# Patient Record
Sex: Male | Born: 1950 | Race: White | Hispanic: No | Marital: Married | Smoking: Never smoker
Health system: Southern US, Community
[De-identification: ages and names within clinical notes are randomized; demographics above are authoritative.]

## PROBLEM LIST (undated history)

## (undated) DIAGNOSIS — L570 Actinic keratosis: Secondary | ICD-10-CM

## (undated) DIAGNOSIS — C801 Malignant (primary) neoplasm, unspecified: Secondary | ICD-10-CM

## (undated) DIAGNOSIS — I10 Essential (primary) hypertension: Secondary | ICD-10-CM

## (undated) HISTORY — DX: Actinic keratosis: L57.0

## (undated) HISTORY — PX: SKIN CANCER EXCISION: SHX779

## (undated) HISTORY — DX: Malignant (primary) neoplasm, unspecified: C80.1

## (undated) HISTORY — PX: COLONOSCOPY WITH PROPOFOL: SHX5780

---

## 2009-01-25 ENCOUNTER — Emergency Department: Payer: Self-pay | Admitting: Emergency Medicine

## 2010-02-02 ENCOUNTER — Emergency Department: Payer: Self-pay | Admitting: Emergency Medicine

## 2010-02-05 ENCOUNTER — Emergency Department: Payer: Self-pay | Admitting: Emergency Medicine

## 2010-02-09 ENCOUNTER — Emergency Department: Payer: Self-pay | Admitting: Emergency Medicine

## 2010-02-17 ENCOUNTER — Emergency Department: Payer: Self-pay | Admitting: Emergency Medicine

## 2010-03-02 ENCOUNTER — Emergency Department: Payer: Self-pay | Admitting: Emergency Medicine

## 2014-12-13 ENCOUNTER — Ambulatory Visit (INDEPENDENT_AMBULATORY_CARE_PROVIDER_SITE_OTHER): Payer: 59 | Admitting: Family Medicine

## 2014-12-13 ENCOUNTER — Encounter: Payer: Self-pay | Admitting: Family Medicine

## 2014-12-13 VITALS — BP 123/68 | HR 69 | Resp 16 | Ht 71.0 in | Wt 220.0 lb

## 2014-12-13 DIAGNOSIS — Z0000 Encounter for general adult medical examination without abnormal findings: Secondary | ICD-10-CM | POA: Diagnosis not present

## 2014-12-13 DIAGNOSIS — Z85828 Personal history of other malignant neoplasm of skin: Secondary | ICD-10-CM | POA: Insufficient documentation

## 2014-12-13 DIAGNOSIS — Z23 Encounter for immunization: Secondary | ICD-10-CM | POA: Diagnosis not present

## 2014-12-13 DIAGNOSIS — T7840XA Allergy, unspecified, initial encounter: Secondary | ICD-10-CM | POA: Insufficient documentation

## 2014-12-13 DIAGNOSIS — Z85820 Personal history of malignant melanoma of skin: Secondary | ICD-10-CM | POA: Insufficient documentation

## 2014-12-13 NOTE — Progress Notes (Signed)
Name: Zachary Wall   MRN: [id number]    DOB: September 09, 1950   Date:12/13/2014       Progress Note  Subjective  Chief Complaint  Chief Complaint  Patient presents with  . Annual Exam    HPI  Here for annual physical exam.  Hx. Of malignant melanoma in situ.  Followed by Dr. Nehemiah Massed, Derm.  No c/o.  Has never had colonoscopy.  Work Architect.  Multiple minor cuts.  Can't remember last tet. Toxoid immun.  Past Medical History  Diagnosis Date  . Cancer     skin    History reviewed. No pertinent past surgical history.  Family History  Problem Relation Age of Onset  . Family history unknown: Yes    History   Social History  . Marital Status: Married    Spouse Name: N/A  . Number of Children: N/A  . Years of Education: N/A   Occupational History  . Not on file.   Social History Main Topics  . Smoking status: Never Smoker   . Smokeless tobacco: Never Used  . Alcohol Use: No  . Drug Use: No  . Sexual Activity: Not on file   Other Topics Concern  . Not on file   Social History Narrative  . No narrative on file     Current outpatient prescriptions:  .  Cetirizine HCl (ZYRTEC ALLERGY) 10 MG CAPS, Take by mouth., Disp: , Rfl:  .  diphenhydrAMINE (BENADRYL ALLERGY) 25 mg capsule, Take by mouth., Disp: , Rfl:  .  predniSONE (STERAPRED UNI-PAK 21 TAB) 10 MG (21) TBPK tablet, Take by mouth., Disp: , Rfl:  .  ranitidine (ZANTAC) 150 MG capsule, Take by mouth., Disp: , Rfl:   Allergies  Allergen Reactions  . Azithromycin Rash     Review of Systems  Constitutional: Negative.  Negative for fever, chills, weight loss and malaise/fatigue.  HENT: Negative.  Negative for congestion, ear pain and hearing loss.   Eyes: Negative.  Negative for blurred vision and double vision.  Respiratory: Negative.  Negative for cough, sputum production, shortness of breath and wheezing.   Cardiovascular: Negative.  Negative for chest pain, palpitations, orthopnea and leg swelling.   Gastrointestinal: Negative.  Negative for heartburn, nausea, vomiting, abdominal pain, diarrhea and blood in stool.  Genitourinary: Negative.  Negative for dysuria, urgency, frequency, hematuria and flank pain.  Musculoskeletal: Negative.  Negative for myalgias, back pain, joint pain and neck pain.  Skin: Negative.  Negative for rash.  Neurological: Negative.  Negative for dizziness, tremors, sensory change, seizures, weakness and headaches.  Endo/Heme/Allergies: Negative.  Negative for polydipsia.  Psychiatric/Behavioral: Negative.  Negative for depression, suicidal ideas and memory loss. The patient is not nervous/anxious and does not have insomnia.       Objective  Filed Vitals:   12/13/14 0917  BP: 123/68  Pulse: 69  Resp: 16  Height: 5\' 11"  (1.803 m)  Weight: 220 lb (99.791 kg)    Physical Exam  Constitutional: He is oriented to person, place, and time and well-developed, well-nourished, and in no distress. No distress.  HENT:  Head: Normocephalic and atraumatic.  Right Ear: External ear normal.  Left Ear: External ear normal.  Nose: Nose normal.  Mouth/Throat: Oropharynx is clear and moist. No oropharyngeal exudate.  Eyes: Conjunctivae and EOM are normal. Pupils are equal, round, and reactive to light. No scleral icterus.  Fundoscopic exam:      The right eye shows no arteriolar narrowing, no AV nicking, no hemorrhage and no  papilledema.       The left eye shows no arteriolar narrowing, no AV nicking, no hemorrhage and no papilledema.  Neck: Neck supple. No tracheal deviation present. No thyromegaly present.  Cardiovascular: Normal rate, regular rhythm, normal heart sounds and intact distal pulses.  Exam reveals no gallop and no friction rub.   No murmur heard. Pulmonary/Chest: Effort normal and breath sounds normal. No respiratory distress. He has no wheezes. He exhibits no tenderness.  Abdominal: Soft. Bowel sounds are normal. He exhibits no distension and no mass.  There is no tenderness.  Genitourinary: Rectum normal, testes/scrotum normal and penis normal. Prostate is enlarged (slightly enlarged, no asssymetry). Prostate is not tender. No discharge found.  Musculoskeletal: Normal range of motion. He exhibits no edema or tenderness.  Lymphadenopathy:    He has no cervical adenopathy.  Neurological: He is alert and oriented to person, place, and time. No cranial nerve deficit.  Skin: Skin is warm and dry. Lesion: multiple diffuse AKs.    Old Melanoma removal scar on L. mid to upper back.  Psychiatric: Memory, affect and judgment normal.  Vitals reviewed.         Assessment & Plan  Problem List Items Addressed This Visit      Other   Encounter for general adult medical examination without abnormal findings - Primary   Relevant Orders   Ambulatory referral to Gastroenterology   Tdap vaccine greater than or equal to 7yo IM   Lipid Profile   CBC with Differential   Comprehensive Metabolic Panel (CMET)   PSA   H/O malignant neoplasm of skin      1. Encounter for general adult medical examination without abnormal findings  - Ambulatory referral to Gastroenterology - Tdap vaccine greater than or equal to 7yo IM  2. H/O malignant neoplasm of skin Continue to follow up with Dr. Nehemiah Massed, Derm.

## 2014-12-15 LAB — CBC WITH DIFFERENTIAL/PLATELET
BASOS: 0 %
Basophils Absolute: 0 10*3/uL (ref 0.0–0.2)
EOS (ABSOLUTE): 0.2 10*3/uL (ref 0.0–0.4)
Eos: 4 %
HEMATOCRIT: 45 % (ref 37.5–51.0)
Hemoglobin: 16 g/dL (ref 12.6–17.7)
IMMATURE GRANS (ABS): 0 10*3/uL (ref 0.0–0.1)
Immature Granulocytes: 0 %
Lymphocytes Absolute: 2.1 10*3/uL (ref 0.7–3.1)
Lymphs: 39 %
MCH: 32.3 pg (ref 26.6–33.0)
MCHC: 35.6 g/dL (ref 31.5–35.7)
MCV: 91 fL (ref 79–97)
MONOS ABS: 0.7 10*3/uL (ref 0.1–0.9)
Monocytes: 13 %
Neutrophils Absolute: 2.4 10*3/uL (ref 1.4–7.0)
Neutrophils: 44 %
Platelets: 216 10*3/uL (ref 150–379)
RBC: 4.95 x10E6/uL (ref 4.14–5.80)
RDW: 12.4 % (ref 12.3–15.4)
WBC: 5.4 10*3/uL (ref 3.4–10.8)

## 2014-12-16 LAB — COMPREHENSIVE METABOLIC PANEL
ALT: 12 IU/L (ref 0–44)
AST: 19 IU/L (ref 0–40)
Albumin/Globulin Ratio: 1.9 (ref 1.1–2.5)
Albumin: 4.3 g/dL (ref 3.6–4.8)
Alkaline Phosphatase: 51 IU/L (ref 39–117)
BUN/Creatinine Ratio: 19 (ref 10–22)
BUN: 18 mg/dL (ref 8–27)
Bilirubin Total: 0.8 mg/dL (ref 0.0–1.2)
CALCIUM: 9 mg/dL (ref 8.6–10.2)
CO2: 22 mmol/L (ref 18–29)
CREATININE: 0.96 mg/dL (ref 0.76–1.27)
Chloride: 100 mmol/L (ref 97–108)
GFR calc Af Amer: 98 mL/min/{1.73_m2} (ref >59)
GFR, EST NON AFRICAN AMERICAN: 85 mL/min/{1.73_m2} (ref >59)
GLOBULIN, TOTAL: 2.3 g/dL (ref 1.5–4.5)
Glucose: 94 mg/dL (ref 65–99)
Potassium: 4.9 mmol/L (ref 3.5–5.2)
SODIUM: 139 mmol/L (ref 134–144)
Total Protein: 6.6 g/dL (ref 6.0–8.5)

## 2014-12-16 LAB — LIPID PANEL
CHOL/HDL RATIO: 3.7 ratio (ref 0.0–5.0)
CHOLESTEROL TOTAL: 175 mg/dL (ref 100–199)
HDL: 47 mg/dL (ref >39)
LDL Calculated: 114 mg/dL — ABNORMAL HIGH (ref 0–99)
TRIGLYCERIDES: 70 mg/dL (ref 0–149)
VLDL Cholesterol Cal: 14 mg/dL (ref 5–40)

## 2014-12-16 LAB — PSA: PROSTATE SPECIFIC AG, SERUM: 1.4 ng/mL (ref 0.0–4.0)

## 2014-12-20 NOTE — Progress Notes (Signed)
Sent letter with results. [facility]

## 2015-03-21 LAB — HM COLONOSCOPY

## 2015-05-12 ENCOUNTER — Encounter: Payer: Self-pay | Admitting: Family Medicine

## 2015-05-16 ENCOUNTER — Encounter: Payer: Self-pay | Admitting: Family Medicine

## 2016-04-30 ENCOUNTER — Ambulatory Visit (INDEPENDENT_AMBULATORY_CARE_PROVIDER_SITE_OTHER): Payer: 59 | Admitting: Family Medicine

## 2016-04-30 ENCOUNTER — Encounter: Payer: Self-pay | Admitting: Family Medicine

## 2016-04-30 VITALS — BP 150/90 | HR 66 | Temp 98.0°F | Resp 16 | Ht 71.0 in | Wt 217.0 lb

## 2016-04-30 DIAGNOSIS — M25519 Pain in unspecified shoulder: Secondary | ICD-10-CM

## 2016-04-30 DIAGNOSIS — Z0000 Encounter for general adult medical examination without abnormal findings: Secondary | ICD-10-CM

## 2016-04-30 DIAGNOSIS — G8929 Other chronic pain: Secondary | ICD-10-CM

## 2016-04-30 DIAGNOSIS — I10 Essential (primary) hypertension: Secondary | ICD-10-CM | POA: Insufficient documentation

## 2016-04-30 DIAGNOSIS — M25511 Pain in right shoulder: Secondary | ICD-10-CM | POA: Diagnosis not present

## 2016-04-30 MED ORDER — LISINOPRIL 10 MG PO TABS: 10.0000 mg | ORAL_TABLET | Freq: Every day | ORAL | 6 refills | Status: DC

## 2016-04-30 NOTE — Progress Notes (Signed)
Name: Zachary Wall   MRN: [id number]    DOB: 06-Sep-1950   Date:04/30/2016       Progress Note  Subjective  Chief Complaint  Chief Complaint  Patient presents with  . Annual Exam    HPI Here for annual health maintenence.  Has hx of malignant melanoma in situ.  Sees Derm once yearly.  His BP has run borderline high recently.  Had some  Lab done for biometrics for work, but he does not know what was checked.  He declines a flu shot.  Had colonoscopy last year with few polyps.  Plan repeat in 5 years.. No problem-specific Assessment & Plan notes found for this encounter.   Past Medical History:  Diagnosis Date  . Cancer ([place])    skin    History reviewed. No pertinent surgical history.  Family History  Problem Relation Age of Onset  . Family history unknown: Yes    Social History   Social History  . Marital status: Married    Spouse name: N/A  . Number of children: N/A  . Years of education: N/A   Occupational History  . Not on file.   Social History Main Topics  . Smoking status: Never Smoker  . Smokeless tobacco: Never Used  . Alcohol use No  . Drug use: No  . Sexual activity: Yes   Other Topics Concern  . Not on file   Social History Narrative  . No narrative on file     Current Outpatient Prescriptions:  .  Cetirizine HCl (ZYRTEC ALLERGY) 10 MG CAPS, Take 1 capsule by mouth daily as needed. , Disp: , Rfl:  .  ranitidine (ZANTAC) 150 MG capsule, Take 150 mg by mouth daily as needed. , Disp: , Rfl:  .  lisinopril (PRINIVIL,ZESTRIL) 10 MG tablet, Take 1 tablet (10 mg total) by mouth daily., Disp: 30 tablet, Rfl: 6  Allergies  Allergen Reactions  . Azithromycin Rash     Review of Systems  Constitutional: Negative for chills, fever, malaise/fatigue and weight loss.  HENT: Negative for hearing loss.   Eyes: Negative for blurred vision and double vision.  Respiratory: Negative for cough, shortness of breath and wheezing.   Cardiovascular: Negative for  chest pain, palpitations, orthopnea and leg swelling.  Gastrointestinal: Negative for abdominal pain, blood in stool and heartburn.  Genitourinary: Negative for dysuria, frequency and urgency.  Musculoskeletal: Positive for joint pain (R shoulder pain). Negative for myalgias.  Skin: Negative for rash.  Neurological: Negative for dizziness, tremors, weakness and headaches.  Psychiatric/Behavioral: Negative for depression. The patient does not have insomnia.       Objective  Vitals:   04/30/16 1406 04/30/16 1517  BP: (!) 155/95 (!) 150/90  Pulse: 66   Resp: 16   Temp: 98 F (36.7 C)   TempSrc: Oral   Weight: 217 lb (98.4 kg)   Height: 5\' 11"  (1.803 m)     Physical Exam  Constitutional: He is oriented to person, place, and time and well-developed, well-nourished, and in no distress. No distress.  HENT:  Head: Normocephalic and atraumatic.  Right Ear: External ear normal.  Left Ear: External ear normal.  Nose: Nose normal.  Mouth/Throat: Oropharynx is clear and moist. No oropharyngeal exudate.  Eyes: Conjunctivae and EOM are normal. Pupils are equal, round, and reactive to light. No scleral icterus.  Fundoscopic exam:      The right eye shows no arteriolar narrowing, no AV nicking, no exudate, no hemorrhage and no papilledema.  The left eye shows no arteriolar narrowing, no AV nicking, no exudate, no hemorrhage and no papilledema.  Neck: Normal range of motion. Neck supple. Carotid bruit is not present. No thyromegaly present.  Cardiovascular: Normal rate, regular rhythm and normal heart sounds.  Exam reveals no gallop and no friction rub.   No murmur heard. Pulmonary/Chest: Effort normal and breath sounds normal. No respiratory distress. He has no wheezes. He has no rales.  Abdominal: Soft. Bowel sounds are normal. He exhibits no distension and no mass. There is no tenderness. There is no rebound and no guarding.  Genitourinary: Rectum normal, prostate normal and penis  normal. No discharge found.  Musculoskeletal: Normal range of motion. He exhibits no edema.  Lymphadenopathy:    He has no cervical adenopathy.  Neurological: He is alert and oriented to person, place, and time.  Skin: Skin is warm and dry.  Multiple scars from skin lesion removals.  Multiple AKs and SKs.  Vitals reviewed.      No results found for this or any previous visit (from the past 2160 hour(s)).   Assessment & Plan  Problem List Items Addressed This Visit      Cardiovascular and Mediastinum   HBP (high blood pressure)   Relevant Medications   lisinopril (PRINIVIL,ZESTRIL) 10 MG tablet   Other Relevant Orders   CBC with Differential   Lipid Profile   Comprehensive Metabolic Panel (CMET)     Other   Healthcare maintenance - Primary   Relevant Orders   PSA   Chronic shoulder pain   Relevant Orders   Ambulatory referral to Orthopedic Surgery    Other Visit Diagnoses   None.     Meds ordered this encounter  Medications  . lisinopril (PRINIVIL,ZESTRIL) 10 MG tablet    Sig: Take 1 tablet (10 mg total) by mouth daily.    Dispense:  30 tablet    Refill:  6   1. Essential hypertension  - CBC with Differential - Lipid Profile - Comprehensive Metabolic Panel (CMET) - lisinopril (PRINIVIL,ZESTRIL) 10 MG tablet; Take 1 tablet (10 mg total) by mouth daily.  Dispense: 30 tablet; Refill: 6  2. Chronic right shoulder pain Refer [facility]  3. Healthcare maintenance  - PSA

## 2016-05-03 LAB — COMPREHENSIVE METABOLIC PANEL
ALT: 24 IU/L (ref 0–44)
AST: 16 IU/L (ref 0–40)
Albumin/Globulin Ratio: 1.8 (ref 1.2–2.2)
Albumin: 4.5 g/dL (ref 3.6–4.8)
Alkaline Phosphatase: 65 IU/L (ref 39–117)
BUN/Creatinine Ratio: 15 (ref 10–24)
BUN: 15 mg/dL (ref 8–27)
Bilirubin Total: 0.5 mg/dL (ref 0.0–1.2)
CO2: 24 mmol/L (ref 18–29)
CREATININE: 0.99 mg/dL (ref 0.76–1.27)
Calcium: 9.4 mg/dL (ref 8.6–10.2)
Chloride: 99 mmol/L (ref 96–106)
GFR, EST AFRICAN AMERICAN: 94 mL/min/{1.73_m2} (ref >59)
GFR, EST NON AFRICAN AMERICAN: 81 mL/min/{1.73_m2} (ref >59)
Globulin, Total: 2.5 g/dL (ref 1.5–4.5)
Glucose: 105 mg/dL — ABNORMAL HIGH (ref 65–99)
Potassium: 4.9 mmol/L (ref 3.5–5.2)
Sodium: 140 mmol/L (ref 134–144)
TOTAL PROTEIN: 7 g/dL (ref 6.0–8.5)

## 2016-05-03 LAB — CBC WITH DIFFERENTIAL/PLATELET
BASOS ABS: 0 10*3/uL (ref 0.0–0.2)
BASOS: 1 %
EOS (ABSOLUTE): 0.2 10*3/uL (ref 0.0–0.4)
Eos: 4 %
Hematocrit: 45.8 % (ref 37.5–51.0)
Hemoglobin: 16.1 g/dL (ref 12.6–17.7)
IMMATURE GRANS (ABS): 0 10*3/uL (ref 0.0–0.1)
IMMATURE GRANULOCYTES: 0 %
LYMPHS: 43 %
Lymphocytes Absolute: 2.4 10*3/uL (ref 0.7–3.1)
MCH: 32.3 pg (ref 26.6–33.0)
MCHC: 35.2 g/dL (ref 31.5–35.7)
MCV: 92 fL (ref 79–97)
MONOS ABS: 0.7 10*3/uL (ref 0.1–0.9)
Monocytes: 13 %
NEUTROS ABS: 2.1 10*3/uL (ref 1.4–7.0)
NEUTROS PCT: 39 %
PLATELETS: 211 10*3/uL (ref 150–379)
RBC: 4.98 x10E6/uL (ref 4.14–5.80)
RDW: 12.6 % (ref 12.3–15.4)
WBC: 5.4 10*3/uL (ref 3.4–10.8)

## 2016-05-03 LAB — LIPID PANEL
CHOLESTEROL TOTAL: 202 mg/dL — AB (ref 100–199)
Chol/HDL Ratio: 3.5 ratio units (ref 0.0–5.0)
HDL: 57 mg/dL (ref >39)
LDL Calculated: 131 mg/dL — ABNORMAL HIGH (ref 0–99)
Triglycerides: 71 mg/dL (ref 0–149)
VLDL CHOLESTEROL CAL: 14 mg/dL (ref 5–40)

## 2016-05-03 LAB — PSA: PROSTATE SPECIFIC AG, SERUM: 2.2 ng/mL (ref 0.0–4.0)

## 2016-05-06 ENCOUNTER — Telehealth: Payer: Self-pay | Admitting: *Deleted

## 2016-05-06 ENCOUNTER — Other Ambulatory Visit: Payer: Self-pay | Admitting: Family Medicine

## 2016-05-06 DIAGNOSIS — R972 Elevated prostate specific antigen [PSA]: Secondary | ICD-10-CM

## 2016-05-06 NOTE — Telephone Encounter (Signed)
Lab result encounter accidentally closed by Dr. Luan Pulling therefore lab result written down. 1) Sugar up 105 A1C added 2. Lipids worse than last year 3) PSA elevated from 1.4 to 2.2. Needs referral to urology. Patient's choice [facility] or [facility].

## 2016-05-11 LAB — HGB A1C W/O EAG: HEMOGLOBIN A1C: 5.7 % — AB (ref 4.8–5.6)

## 2016-05-11 LAB — SPECIMEN STATUS REPORT

## 2016-05-21 ENCOUNTER — Encounter: Payer: Self-pay | Admitting: Urology

## 2016-05-21 ENCOUNTER — Ambulatory Visit (INDEPENDENT_AMBULATORY_CARE_PROVIDER_SITE_OTHER): Payer: 59 | Admitting: Urology

## 2016-05-21 VITALS — BP 160/89 | HR 74 | Ht 72.0 in | Wt 222.2 lb

## 2016-05-21 DIAGNOSIS — R972 Elevated prostate specific antigen [PSA]: Secondary | ICD-10-CM | POA: Insufficient documentation

## 2016-05-21 NOTE — Progress Notes (Signed)
05/21/2016 1:33 PM   BREON ENZ 1950/12/14 [id number]  Referring provider: Arlis Porta., MD [place], [place] [id number]  Chief Complaint  Patient presents with  . New Patient (Initial Visit)    elevated PSA    HPI: Mr Zachary Wall is a 65yo seen for evaluation today for rising PSA. His PSA on referral is 2.2 His PSA 1 year ago was 1.4.  He has mild LUTS with occasional nocturia 1x. No hematuria, no dysuria. No hx of UTI or prostate infections. No hx of prostate biopsy. No family hx of prostate cancer. No other associated symptoms. No exacerbating/alleviating events.    PMH: Past Medical History:  Diagnosis Date  . Cancer [facility])    skin    Surgical History: Past Surgical History:  Procedure Laterality Date  . SKIN CANCER EXCISION      Home Medications:    Medication List       Accurate as of 05/21/16  1:33 PM. Always use your most recent med list.          Cetirizine HCl 10 MG Caps Take by mouth.   lisinopril 10 MG tablet Commonly known as:  PRINIVIL,ZESTRIL Take by mouth.   meloxicam 7.5 MG tablet Commonly known as:  MOBIC Take by mouth.   ranitidine 150 MG capsule Commonly known as:  ZANTAC Take 150 mg by mouth daily as needed.       Allergies:  Allergies  Allergen Reactions  . Azithromycin Rash    Family History: Family History  Problem Relation Age of Onset  . Hematuria Neg Hx   . Prostate cancer Neg Hx   . Tuberculosis Neg Hx     Social History:  reports that he has never smoked. He has never used smokeless tobacco. He reports that he does not drink alcohol or use drugs.  ROS: UROLOGY Frequent Urination?: No Hard to postpone urination?: No Burning/pain with urination?: No Get up at night to urinate?: Yes Leakage of urine?: Yes Urine stream starts and stops?: No Trouble starting stream?: No Do you have to strain to urinate?: No Blood in urine?: No Urinary tract infection?: No Sexually transmitted disease?:  No Injury to kidneys or bladder?: No Painful intercourse?: No Weak stream?: No Erection problems?: Yes Penile pain?: No  Gastrointestinal Nausea?: No Vomiting?: No Indigestion/heartburn?: Yes Diarrhea?: No Constipation?: No  Constitutional Fever: No Night sweats?: No Weight loss?: No Fatigue?: No  Skin Skin rash/lesions?: No Itching?: No  Eyes Blurred vision?: No Double vision?: No  Ears/Nose/Throat Sore throat?: No Sinus problems?: No  Hematologic/Lymphatic Swollen glands?: No Easy bruising?: No  Cardiovascular Leg swelling?: No Chest pain?: No  Respiratory Cough?: No Shortness of breath?: No  Endocrine Excessive thirst?: No  Musculoskeletal Back pain?: No Joint pain?: No  Neurological Headaches?: No Dizziness?: No  Psychologic Depression?: No Anxiety?: No  Physical Exam: BP (!) 160/89   Pulse 74   Ht 6' (1.829 m)   Wt 100.8 kg (222 lb 3.2 oz)   BMI 30.14 kg/m   Constitutional:  Alert and oriented, No acute distress. HEENT: [place] AT, moist mucus membranes.  Trachea midline, no masses. Cardiovascular: No clubbing, cyanosis, or edema. Respiratory: Normal respiratory effort, no increased work of breathing. GI: Abdomen is soft, nontender, nondistended, no abdominal masses GU: No CVA tenderness. Circumcised phallus. No masses/lesiosn on penis/testes/scrotum. Prostate 30g smooth, no induration, no nodules Skin: No rashes, bruises or suspicious lesions. Lymph: No cervical or inguinal adenopathy. Neurologic: Grossly intact, no focal  deficits, moving all 4 extremities. Psychiatric: Normal mood and affect.  Laboratory Data: Lab Results  Component Value Date   WBC 5.4 05/02/2016   HCT 45.8 05/02/2016   MCV 92 05/02/2016   PLT 211 05/02/2016    Lab Results  Component Value Date   CREATININE 0.99 05/02/2016    No results found for: PSA  No results found for: TESTOSTERONE  Lab Results  Component Value Date   HGBA1C 5.7 (H) 05/02/2016     Urinalysis No results found for: COLORURINE, APPEARANCEUR, LABSPEC, PHURINE, GLUCOSEU, HGBUR, BILIRUBINUR, KETONESUR, PROTEINUR, UROBILINOGEN, NITRITE, LEUKOCYTESUR  Pertinent Imaging: none  Assessment & Plan:    1. Increasing PSA -We discussed observation versus biopsy and we have elected to proceed with observation. RTC 6 months with a PSA   There are no diagnoses linked to this encounter.  No Follow-up on file.  Nicolette Bang, MD  [facility] [address] [place], [place] [address] [phone]

## 2016-06-11 ENCOUNTER — Encounter: Payer: Self-pay | Admitting: Family Medicine

## 2016-06-11 ENCOUNTER — Ambulatory Visit (INDEPENDENT_AMBULATORY_CARE_PROVIDER_SITE_OTHER): Payer: 59 | Admitting: Family Medicine

## 2016-06-11 VITALS — BP 145/80 | HR 76 | Temp 97.8°F | Resp 16 | Ht 71.0 in | Wt 226.0 lb

## 2016-06-11 DIAGNOSIS — I10 Essential (primary) hypertension: Secondary | ICD-10-CM | POA: Diagnosis not present

## 2016-06-11 MED ORDER — LISINOPRIL 20 MG PO TABS: 20.0000 mg | ORAL_TABLET | Freq: Every day | ORAL | 6 refills | Status: DC

## 2016-06-11 NOTE — Progress Notes (Signed)
Name: Zachary Wall   MRN: [id number]    DOB: [birth year]   Date:06/11/2016       Progress Note  Subjective  Chief Complaint  Chief Complaint  Patient presents with  . Hypertension    HPI Here for f/u of HBP.  On Lisinopril for 4-6 weeks.  No problems with meds..  On Mobic for R shoulder pain for past 2-3 weeks.  Otherwise doing well.  No problem-specific Assessment & Plan notes found for this encounter.   Past Medical History:  Diagnosis Date  . Cancer [facility])    skin    Past Surgical History:  Procedure Laterality Date  . SKIN CANCER EXCISION      Family History  Problem Relation Age of Onset  . Hematuria Neg Hx   . Prostate cancer Neg Hx   . Tuberculosis Neg Hx     Social History   Social History  . Marital status: Married    Spouse name: N/A  . Number of children: N/A  . Years of education: N/A   Occupational History  . Not on file.   Social History Main Topics  . Smoking status: Never Smoker  . Smokeless tobacco: Never Used  . Alcohol use No  . Drug use: No  . Sexual activity: Yes   Other Topics Concern  . Not on file   Social History Narrative  . No narrative on file     Current Outpatient Prescriptions:  .  lisinopril (PRINIVIL,ZESTRIL) 10 MG tablet, Take by mouth., Disp: , Rfl:  .  meloxicam (MOBIC) 7.5 MG tablet, Take by mouth., Disp: , Rfl:  .  ranitidine (ZANTAC) 150 MG capsule, Take 150 mg by mouth daily as needed. , Disp: , Rfl:   Allergies  Allergen Reactions  . Azithromycin Rash     Review of Systems  Constitutional: Negative for chills, fever, malaise/fatigue and weight loss.  HENT: Negative for hearing loss and tinnitus.   Eyes: Negative for blurred vision and double vision.  Respiratory: Negative for cough, shortness of breath and wheezing.   Cardiovascular: Negative for chest pain, palpitations and leg swelling.  Gastrointestinal: Negative for abdominal pain, blood in stool and heartburn.  Genitourinary: Negative for  dysuria, frequency and urgency.  Musculoskeletal: Positive for joint pain. Negative for myalgias.  Skin: Negative for itching and rash.  Neurological: Negative for dizziness, tingling, tremors, weakness and headaches.      Objective  Vitals:   06/11/16 0842  BP: (!) 159/91  Pulse: 76  Resp: 16  Temp: 97.8 F (36.6 C)  TempSrc: Oral  Weight: 226 lb (102.5 kg)  Height: 5\' 11"  (1.803 m)    Physical Exam  Constitutional: He is oriented to person, place, and time and well-developed, well-nourished, and in no distress. No distress.  HENT:  Head: Normocephalic and atraumatic.  Eyes: Conjunctivae and EOM are normal. Pupils are equal, round, and reactive to light. No scleral icterus.  Neck: Normal range of motion. Neck supple. Carotid bruit is not present. No thyromegaly present.  Cardiovascular: Normal rate, regular rhythm and normal heart sounds.  Exam reveals no gallop and no friction rub.   No murmur heard. Pulmonary/Chest: Effort normal and breath sounds normal. No respiratory distress. He has no wheezes. He has no rales.  Musculoskeletal: He exhibits no edema.  Lymphadenopathy:    He has no cervical adenopathy.  Neurological: He is alert and oriented to person, place, and time. Gait normal.  Vitals reviewed.      Recent Results (  from the past 2160 hour(s))  CBC with Differential     Status: None   Collection Time: 05/02/16  8:21 AM  Result Value Ref Range   WBC 5.4 3.4 - 10.8 x10E3/uL   RBC 4.98 4.14 - 5.80 x10E6/uL   Hemoglobin 16.1 12.6 - 17.7 g/dL   Hematocrit 45.8 37.5 - 51.0 %   MCV 92 79 - 97 fL   MCH 32.3 26.6 - 33.0 pg   MCHC 35.2 31.5 - 35.7 g/dL   RDW 12.6 12.3 - 15.4 %   Platelets 211 150 - 379 x10E3/uL   Neutrophils 39 Not Estab. %   Lymphs 43 Not Estab. %   Monocytes 13 Not Estab. %   Eos 4 Not Estab. %   Basos 1 Not Estab. %   Neutrophils Absolute 2.1 1.4 - 7.0 x10E3/uL   Lymphocytes Absolute 2.4 0.7 - 3.1 x10E3/uL   Monocytes Absolute 0.7 0.1 -  0.9 x10E3/uL   EOS (ABSOLUTE) 0.2 0.0 - 0.4 x10E3/uL   Basophils Absolute 0.0 0.0 - 0.2 x10E3/uL   Immature Granulocytes 0 Not Estab. %   Immature Grans (Abs) 0.0 0.0 - 0.1 x10E3/uL  Lipid Profile     Status: Abnormal   Collection Time: 05/02/16  8:21 AM  Result Value Ref Range   Cholesterol, Total 202 (H) 100 - 199 mg/dL   Triglycerides 71 0 - 149 mg/dL   HDL 57 >39 mg/dL   VLDL Cholesterol Cal 14 5 - 40 mg/dL   LDL Calculated 131 (H) 0 - 99 mg/dL   Chol/HDL Ratio 3.5 0.0 - 5.0 ratio units    Comment:                                   T. Chol/HDL Ratio                                             Men  Women                               1/2 Avg.Risk  3.4    3.3                                   Avg.Risk  5.0    4.4                                2X Avg.Risk  9.6    7.1                                3X Avg.Risk 23.4   11.0   PSA     Status: None   Collection Time: 05/02/16  8:21 AM  Result Value Ref Range   Prostate Specific Ag, Serum 2.2 0.0 - 4.0 ng/mL    Comment: Roche ECLIA methodology. According to the American Urological Association, Serum PSA should decrease and remain at undetectable levels after radical prostatectomy. The AUA defines biochemical recurrence as an initial PSA value 0.2 ng/mL or greater followed by a subsequent confirmatory PSA value 0.2 ng/mL or  greater. Values obtained with different assay methods or kits cannot be used interchangeably. Results cannot be interpreted as absolute evidence of the presence or absence of malignant disease.   Comprehensive Metabolic Panel (CMET)     Status: Abnormal   Collection Time: 05/02/16  8:21 AM  Result Value Ref Range   Glucose 105 (H) 65 - 99 mg/dL   BUN 15 8 - 27 mg/dL   Creatinine, Ser 0.99 0.76 - 1.27 mg/dL   GFR calc non Af Amer 81 >59 mL/min/1.73   GFR calc Af Amer 94 >59 mL/min/1.73   BUN/Creatinine Ratio 15 10 - 24   Sodium 140 134 - 144 mmol/L   Potassium 4.9 3.5 - 5.2 mmol/L   Chloride 99 96 - 106  mmol/L   CO2 24 18 - 29 mmol/L   Calcium 9.4 8.6 - 10.2 mg/dL   Total Protein 7.0 6.0 - 8.5 g/dL   Albumin 4.5 3.6 - 4.8 g/dL   Globulin, Total 2.5 1.5 - 4.5 g/dL   Albumin/Globulin Ratio 1.8 1.2 - 2.2   Bilirubin Total 0.5 0.0 - 1.2 mg/dL   Alkaline Phosphatase 65 39 - 117 IU/L   AST 16 0 - 40 IU/L   ALT 24 0 - 44 IU/L  Hgb A1c w/o eAG     Status: Abnormal   Collection Time: 05/02/16  8:21 AM  Result Value Ref Range   Hgb A1c MFr Bld 5.7 (H) 4.8 - 5.6 %    Comment:          Pre-diabetes: 5.7 - 6.4          Diabetes: >6.4          Glycemic control for adults with diabetes: <7.0   Specimen status report     Status: None   Collection Time: 05/02/16  8:21 AM  Result Value Ref Range   specimen status report Comment     Comment: Written Authorization Written Authorization Written Authorization Received. Authorization received from [place] 05-11-2016 Logged by [place]  Problem List Items Addressed This Visit    None      No orders of the defined types were placed in this encounter.

## 2016-06-17 DIAGNOSIS — C4492 Squamous cell carcinoma of skin, unspecified: Secondary | ICD-10-CM

## 2016-06-17 HISTORY — DX: Squamous cell carcinoma of skin, unspecified: C44.92

## 2016-07-30 ENCOUNTER — Encounter: Payer: Self-pay | Admitting: Family Medicine

## 2016-07-30 ENCOUNTER — Ambulatory Visit (INDEPENDENT_AMBULATORY_CARE_PROVIDER_SITE_OTHER): Payer: 59 | Admitting: Family Medicine

## 2016-07-30 VITALS — BP 115/75 | HR 66 | Temp 98.3°F | Ht 71.0 in | Wt 223.0 lb

## 2016-07-30 DIAGNOSIS — I10 Essential (primary) hypertension: Secondary | ICD-10-CM

## 2016-07-30 MED ORDER — LISINOPRIL 10 MG PO TABS: 10.0000 mg | ORAL_TABLET | Freq: Every day | ORAL | 6 refills | Status: DC

## 2016-07-30 NOTE — Progress Notes (Signed)
Name: Zachary Wall   MRN: [id number]    DOB: July 01, 1951   Date:07/30/2016       Progress Note  Subjective  Chief Complaint  Chief Complaint  Patient presents with  . Hypertension    HPI Here for f/u of HBP and GERD.  He is taking meds and does not have any problems at present.  He has been educated on ACE cough if necessary.  No problem-specific Assessment & Plan notes found for this encounter.   Past Medical History:  Diagnosis Date  . Cancer [facility])    skin    Past Surgical History:  Procedure Laterality Date  . SKIN CANCER EXCISION      Family History  Problem Relation Age of Onset  . Hematuria Neg Hx   . Prostate cancer Neg Hx   . Tuberculosis Neg Hx     Social History   Social History  . Marital status: Married    Spouse name: N/A  . Number of children: N/A  . Years of education: N/A   Occupational History  . Not on file.   Social History Main Topics  . Smoking status: Never Smoker  . Smokeless tobacco: Never Used  . Alcohol use No  . Drug use: No  . Sexual activity: Yes   Other Topics Concern  . Not on file   Social History Narrative  . No narrative on file     Current Outpatient Prescriptions:  .  lisinopril (PRINIVIL,ZESTRIL) 10 MG tablet, Take 1 tablet (10 mg total) by mouth daily., Disp: 30 tablet, Rfl: 6 .  ranitidine (ZANTAC) 150 MG capsule, Take 150 mg by mouth daily as needed. , Disp: , Rfl:   Allergies  Allergen Reactions  . Azithromycin Rash     Review of Systems  Constitutional: Negative for chills, fever, malaise/fatigue and weight loss.  HENT: Negative for hearing loss and tinnitus.   Eyes: Negative for blurred vision and double vision.  Respiratory: Negative for cough, shortness of breath and wheezing.   Cardiovascular: Negative for chest pain, palpitations and leg swelling.  Gastrointestinal: Positive for heartburn (under control with Zantac). Negative for abdominal pain, blood in stool, diarrhea, nausea and vomiting.   Genitourinary: Negative for dysuria, frequency and urgency.  Musculoskeletal: Negative for joint pain and myalgias.  Skin: Negative for rash.  Neurological: Negative for dizziness, tingling, tremors, weakness and headaches.      Objective  Vitals:   07/30/16 1555 07/30/16 1638  BP: 117/77 115/75  Pulse: 66   Temp: 98.3 F (36.8 C)   TempSrc: Oral   Weight: 223 lb (101.2 kg)   Height: 5\' 11"  (1.803 m)     Physical Exam  Constitutional: He is oriented to person, place, and time and well-developed, well-nourished, and in no distress. No distress.  HENT:  Head: Normocephalic and atraumatic.  Eyes: Conjunctivae and EOM are normal. Pupils are equal, round, and reactive to light. No scleral icterus.  Neck: Normal range of motion. Neck supple. Carotid bruit is not present. No thyromegaly present.  Cardiovascular: Normal rate, regular rhythm and normal heart sounds.  Exam reveals no gallop and no friction rub.   No murmur heard. Pulmonary/Chest: Effort normal and breath sounds normal. No respiratory distress. He has no wheezes. He has no rales.  Abdominal: Soft. Bowel sounds are normal. He exhibits no distension and no mass. There is no tenderness.  Musculoskeletal: Normal range of motion. He exhibits no edema.  Lymphadenopathy:    He has no cervical adenopathy.  Neurological: He is alert and oriented to person, place, and time.  Vitals reviewed.      Recent Results (from the past 2160 hour(s))  CBC with Differential     Status: None   Collection Time: 05/02/16  8:21 AM  Result Value Ref Range   WBC 5.4 3.4 - 10.8 x10E3/uL   RBC 4.98 4.14 - 5.80 x10E6/uL   Hemoglobin 16.1 12.6 - 17.7 g/dL   Hematocrit 45.8 37.5 - 51.0 %   MCV 92 79 - 97 fL   MCH 32.3 26.6 - 33.0 pg   MCHC 35.2 31.5 - 35.7 g/dL   RDW 12.6 12.3 - 15.4 %   Platelets 211 150 - 379 x10E3/uL   Neutrophils 39 Not Estab. %   Lymphs 43 Not Estab. %   Monocytes 13 Not Estab. %   Eos 4 Not Estab. %   Basos 1  Not Estab. %   Neutrophils Absolute 2.1 1.4 - 7.0 x10E3/uL   Lymphocytes Absolute 2.4 0.7 - 3.1 x10E3/uL   Monocytes Absolute 0.7 0.1 - 0.9 x10E3/uL   EOS (ABSOLUTE) 0.2 0.0 - 0.4 x10E3/uL   Basophils Absolute 0.0 0.0 - 0.2 x10E3/uL   Immature Granulocytes 0 Not Estab. %   Immature Grans (Abs) 0.0 0.0 - 0.1 x10E3/uL  Lipid Profile     Status: Abnormal   Collection Time: 05/02/16  8:21 AM  Result Value Ref Range   Cholesterol, Total 202 (H) 100 - 199 mg/dL   Triglycerides 71 0 - 149 mg/dL   HDL 57 >39 mg/dL   VLDL Cholesterol Cal 14 5 - 40 mg/dL   LDL Calculated 131 (H) 0 - 99 mg/dL   Chol/HDL Ratio 3.5 0.0 - 5.0 ratio units    Comment:                                   T. Chol/HDL Ratio                                             Men  Women                               1/2 Avg.Risk  3.4    3.3                                   Avg.Risk  5.0    4.4                                2X Avg.Risk  9.6    7.1                                3X Avg.Risk 23.4   11.0   PSA     Status: None   Collection Time: 05/02/16  8:21 AM  Result Value Ref Range   Prostate Specific Ag, Serum 2.2 0.0 - 4.0 ng/mL    Comment: Roche ECLIA methodology. According to the American Urological Association, Serum PSA should decrease and remain at undetectable levels after radical prostatectomy. The AUA defines  biochemical recurrence as an initial PSA value 0.2 ng/mL or greater followed by a subsequent confirmatory PSA value 0.2 ng/mL or greater. Values obtained with different assay methods or kits cannot be used interchangeably. Results cannot be interpreted as absolute evidence of the presence or absence of malignant disease.   Comprehensive Metabolic Panel (CMET)     Status: Abnormal   Collection Time: 05/02/16  8:21 AM  Result Value Ref Range   Glucose 105 (H) 65 - 99 mg/dL   BUN 15 8 - 27 mg/dL   Creatinine, Ser 0.99 0.76 - 1.27 mg/dL   GFR calc non Af Amer 81 >59 mL/min/1.73   GFR calc Af Amer 94 >59  mL/min/1.73   BUN/Creatinine Ratio 15 10 - 24   Sodium 140 134 - 144 mmol/L   Potassium 4.9 3.5 - 5.2 mmol/L   Chloride 99 96 - 106 mmol/L   CO2 24 18 - 29 mmol/L   Calcium 9.4 8.6 - 10.2 mg/dL   Total Protein 7.0 6.0 - 8.5 g/dL   Albumin 4.5 3.6 - 4.8 g/dL   Globulin, Total 2.5 1.5 - 4.5 g/dL   Albumin/Globulin Ratio 1.8 1.2 - 2.2   Bilirubin Total 0.5 0.0 - 1.2 mg/dL   Alkaline Phosphatase 65 39 - 117 IU/L   AST 16 0 - 40 IU/L   ALT 24 0 - 44 IU/L  Hgb A1c w/o eAG     Status: Abnormal   Collection Time: 05/02/16  8:21 AM  Result Value Ref Range   Hgb A1c MFr Bld 5.7 (H) 4.8 - 5.6 %    Comment:          Pre-diabetes: 5.7 - 6.4          Diabetes: >6.4          Glycemic control for adults with diabetes: <7.0   Specimen status report     Status: None   Collection Time: 05/02/16  8:21 AM  Result Value Ref Range   specimen status report Comment     Comment: Written Authorization Written Authorization Written Authorization Received. Authorization received from [place] 05-11-2016 Logged by [place]      Assessment & Plan  Problem List Items Addressed This Visit      Cardiovascular and Mediastinum   HBP (high blood pressure) - Primary   Relevant Medications   lisinopril (PRINIVIL,ZESTRIL) 10 MG tablet      Meds ordered this encounter  Medications  . lisinopril (PRINIVIL,ZESTRIL) 10 MG tablet    Sig: Take 1 tablet (10 mg total) by mouth daily.    Dispense:  30 tablet    Refill:  6   1. Essential hypertension  - lisinopril (PRINIVIL,ZESTRIL) 10 MG tablet; Take 1 tablet (10 mg total) by mouth daily.  Dispense: 30 tablet; Refill: 6

## 2016-10-10 ENCOUNTER — Encounter: Payer: Self-pay | Admitting: Family Medicine

## 2016-10-10 ENCOUNTER — Ambulatory Visit (INDEPENDENT_AMBULATORY_CARE_PROVIDER_SITE_OTHER): Payer: 59 | Admitting: Family Medicine

## 2016-10-10 VITALS — BP 122/72 | HR 70 | Temp 98.4°F | Resp 16 | Ht 71.0 in | Wt 222.0 lb

## 2016-10-10 DIAGNOSIS — E7800 Pure hypercholesterolemia, unspecified: Secondary | ICD-10-CM | POA: Diagnosis not present

## 2016-10-10 DIAGNOSIS — R972 Elevated prostate specific antigen [PSA]: Secondary | ICD-10-CM

## 2016-10-10 DIAGNOSIS — R7303 Prediabetes: Secondary | ICD-10-CM | POA: Diagnosis not present

## 2016-10-10 DIAGNOSIS — I10 Essential (primary) hypertension: Secondary | ICD-10-CM

## 2016-10-10 DIAGNOSIS — Z9189 Other specified personal risk factors, not elsewhere classified: Secondary | ICD-10-CM | POA: Insufficient documentation

## 2016-10-10 DIAGNOSIS — Z0000 Encounter for general adult medical examination without abnormal findings: Secondary | ICD-10-CM | POA: Diagnosis not present

## 2016-10-10 DIAGNOSIS — N528 Other male erectile dysfunction: Secondary | ICD-10-CM | POA: Diagnosis not present

## 2016-10-10 DIAGNOSIS — E785 Hyperlipidemia, unspecified: Secondary | ICD-10-CM | POA: Insufficient documentation

## 2016-10-10 MED ORDER — LISINOPRIL 10 MG PO TABS: 10.0000 mg | ORAL_TABLET | Freq: Every day | ORAL | 3 refills | Status: DC

## 2016-10-10 MED ORDER — SILDENAFIL CITRATE 20 MG PO TABS: ORAL_TABLET | ORAL | 3 refills | Status: DC

## 2016-10-10 NOTE — Patient Instructions (Signed)
Thank you for coming in to clinic today.   1. Keep up the good work with your lifestyle changes improved diet and plan to start exercise  2. Make sure that we receive a copy of your [facility] blood work.  3. Continue Lisinopril 10mg  daily sent new rx today  4. Start baby aspirin 81mg  daily to reduce risk of cardiovascular disease (heart attack or stroke), as discussed your cholesterol was slightly elevated last time in 05/2016, you do meet criteria to consider starting one of the statin medications such as Lipitor (Atorvastatin) Crestor (Rosuvastatin), think about these options for the future if cholesterol is not improved, otherwise try to control it just with exercise and lifestyle.  5. Start Sildenafil generic viagra as needed for erectile function, please review this with your Urologist as well at next appointment, as they can provide additional advice and management for this problem  6. Check BP at home and write down readings, as long as < 140/90 consistently then keep on current plan, if elevated BP consistently can notify our office  You will be due for FASTING BLOOD WORK (no food or drink after midnight before, only water or coffee without cream/sugar on the morning of)  [facility] - labs due in 7 months - 05/2017 - we will try to call you about 1 month before your lab is due and we will order the labs you can come by to pick up printed lab orders.  Please schedule a follow-up appointment with Dr. Parks Ranger in 7 months for follow-up HTN, lab results  If you have any other questions or concerns, please feel free to call the clinic or send a message through [place]. You may also schedule an earlier appointment if necessary.  Nobie Putnam, DO [place]

## 2016-10-10 NOTE — Assessment & Plan Note (Signed)
Followed by Urology ([facility]) only mild elevation previously 1.2 to 2.2, due for repeat PSA soon - No changes in symptoms at this time - Suspect this is mild BPH, seems to have minimal LUTS as well but does endorse ED

## 2016-10-10 NOTE — Assessment & Plan Note (Signed)
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

## 2016-10-10 NOTE — Assessment & Plan Note (Signed)
Well-controlled mild HTN, only recent diagnosed age >20 No complications   Plan:  1. Continue current Lisinopril 10mg  - sent new rx 10mg  tabs instead of half 20mg  tabs 2. Check chemistry through outside [place], and again in 6 months 05/2017 for Annual physical 3. Encourage improve regular exercise, low sodium diet 4. Monitor BP at home or at drug store occasionally - to determine BP control for future management of meds - patient expresses desire to be off anti-HTN meds if possible 5. Follow-up 6-7 months annual physical, sooner if BP uncontrolled

## 2016-10-10 NOTE — Assessment & Plan Note (Signed)
Last lipids 05/2016 elevated LDL from 110s to 130 ASCVD 10 yr risk score from those results calculated at 12.1% risk  Plan: 1. Discussion today on ASCVD risk and reduction - agree to start ASA 81mg  daily, hold statin therapy 2. Improve lifestyle with exercise diet to reduce cholesterol first 3. Follow-up future fasting lipids form [facility] and place future orders [facility] in 6-7 months for Annual Physical

## 2016-10-10 NOTE — Assessment & Plan Note (Signed)
Last A1c 5.7, improving DM diet Never on meds No complications  Plan: 1. Check A1c through [facility] biometrics, review result 2. Follow-up 6-7 months repeat A1c for Annual Physical

## 2016-10-10 NOTE — Progress Notes (Signed)
Subjective:    Patient ID: Zachary Wall, male    DOB: [birth year], 66 y.o.   MRN: [id number]  KAIMANI Wall is a 66 y.o. male presenting on 10/10/2016 for Annual Exam  HPI   CHRONIC HTN: He was first diagnosed with HTN about 1 year ago (previous only borderline elevated) and started anti-HTN med with lisinopril 10mg  daily. Reports last visit 07/30/16, he continued to adjust anti-HTN medication, was on Lisinopril 10s then increased to 20mg , then advised to switch back to 10s he cut them in half and has been taking daily. Has never been on other anti-HTN. - He does not check BP at home Current Meds - Lisinopril 20mg  (HALF tab for 10mg  daily) Reports good compliance, took meds today. Tolerating well, w/o complaints. Lifestyle: (see below) - Drinks caffeine with 3 diet mtn dew daily  Pre-Diabetes / OBESITY BMI >30 - He is due soon for [facility] biometrics for insurance purposes, will send [place] results - Prior A1c up to 5.7 in 05/2016 with new diagnosis Pre-DM. Weight down 4 lbs in past 4 months - He has tried to improve diet at home, following wife's diet, lower carb diet and reduced sweets, drinks mostly water and some diet mountain dew occasionally - Exercises more during summer with in ground pool and he swims daily, but during winter less exercise, has a bicycle but less activity during winter. He is interested in resuming walking now with warmer weather with wife. 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  HYPERLIPIDEMIA: - Reports no concerns. Last lipid panel 05/2016, mild elevated LDL 131, total cholesterol 202, normal HDL 57. 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  Erectile Dysfunction: - Chronic problem, he states he can obtain a full erection, but he often will lose erection and has problem maintaining erection,  - Never taken any medications for ED. - States he remains sexually active with his wife, no other partners - Admits to good sexual desire - Denies significant LUTS  Health Maintenance: - Last colonoscopy done by Dr Vira Agar ([place]) 03/21/15, polyps, next due 5 years (approx 03/2020) - No known family history of prostate cancer. Has brother with BPH. His PSA was 1.4 and increased up to 2.2, was referred to Urology Dr Ernie Hew, now will return for next PSA. Stated last DRE was mild enlarged prostate.   Past Medical History:  Diagnosis Date  . Cancer [facility])    skin   Past Surgical History:  Procedure Laterality Date  . SKIN CANCER EXCISION     Social History   Social History  . Marital status: Married    Spouse name: N/A  . Number of children: N/A  . Years of education: N/A   Occupational History  . Not on file.   Social History Main Topics  . Smoking status: Never Smoker  . Smokeless tobacco: Never Used  . Alcohol use No  . Drug use: No  . Sexual activity: Yes   Other Topics Concern  . Not on file   Social History Narrative  . No narrative on file   Family History  Problem  Relation Age of Onset  . Hematuria Neg Hx   . Prostate cancer Neg Hx   . Tuberculosis Neg Hx    Current Outpatient Prescriptions on File Prior to Visit  Medication Sig  . ranitidine (ZANTAC) 150 MG capsule Take 150 mg by mouth daily as needed.    No current facility-administered medications on file prior to visit.     Review of Systems  Constitutional: Negative for activity change, appetite change, chills, diaphoresis, fatigue and fever.  HENT: Negative for congestion, hearing loss and sinus pressure.   Eyes: Negative for visual disturbance.  Respiratory: Negative for  cough, chest tightness, shortness of breath and wheezing.   Cardiovascular: Negative for chest pain, palpitations and leg swelling.  Gastrointestinal: Negative for abdominal pain, constipation, diarrhea, nausea and vomiting.  Endocrine: Negative for cold intolerance and polyuria.  Genitourinary: Negative for decreased urine volume, difficulty urinating, dysuria, frequency and hematuria.  Musculoskeletal: Negative for arthralgias, back pain and neck pain.  Skin: Negative for rash.       History of sun damage  Allergic/Immunologic: Negative for environmental allergies.  Neurological: Negative for dizziness, weakness, light-headedness, numbness and headaches.  Hematological: Negative for adenopathy.  Psychiatric/Behavioral: Negative for behavioral problems, dysphoric mood and sleep disturbance.   Per HPI unless specifically indicated above     Objective:    BP 122/72 (BP Location: Left Arm, Cuff Size: Normal)   Pulse 70   Temp 98.4 F (36.9 C) (Oral)   Resp 16   Ht 5\' 11"  (1.803 m)   Wt 222 lb (100.7 kg)   BMI 30.96 kg/m   Wt Readings from Last 3 Encounters:  10/10/16 222 lb (100.7 kg)  07/30/16 223 lb (101.2 kg)  06/11/16 226 lb (102.5 kg)    Physical Exam  Constitutional: He is oriented to person, place, and time. He appears well-developed and well-nourished. No distress.  Well-appearing, comfortable, cooperative  HENT:  Head: Normocephalic and atraumatic.  Mouth/Throat: Oropharynx is clear and moist.  Frontal / maxillary sinuses non-tender. Nares patent without purulence or edema. Bilateral TMs clear without erythema, effusion or bulging. Oropharynx clear without erythema, exudates, edema or asymmetry.  Eyes: Conjunctivae and EOM are normal. Pupils are equal, round, and reactive to light.  Neck: Normal range of motion. Neck supple. No thyromegaly present.  No carotid bruits  Cardiovascular: Normal rate, regular rhythm, normal heart sounds and intact distal pulses.   No  murmur heard. Pulmonary/Chest: Effort normal and breath sounds normal. No respiratory distress. He has no wheezes. He has no rales.  Abdominal: Soft. Bowel sounds are normal. He exhibits no distension and no mass. There is no tenderness.  Genitourinary:  Genitourinary Comments: Deferred genital exam today  Musculoskeletal: Normal range of motion. He exhibits no edema or tenderness.  Back normal without deformity or abnormal curvature.  Upper / Lower Extremities: - Normal muscle tone, strength bilateral upper extremities 5/5, lower extremities 5/5 - Bilateral shoulders, knees, wrist, ankles without deformity, tenderness, effusion  - Normal Gait  Lymphadenopathy:    He has no cervical adenopathy.  Neurological: He is alert and oriented to person, place, and time.  Skin: Skin is warm and dry. No rash noted. He is not diaphoretic.  Appearance of significant sun damage to skin mostly facial and other areas, with evidence of prior actinic keratoses s/p cryotherapy by dermatology  Psychiatric: He has a normal mood and affect. His behavior is normal.  Well groomed, good eye contact, normal speech and thoughts  Nursing note and vitals reviewed.  I have personally reviewed the following lab results from 03/2016.  Results for orders placed or performed in visit on 04/30/16  CBC with Differential  Result Value Ref Range   WBC 5.4 3.4 - 10.8 x10E3/uL   RBC 4.98 4.14 - 5.80 x10E6/uL   Hemoglobin 16.1 12.6 - 17.7 g/dL   Hematocrit 45.8 37.5 - 51.0 %   MCV 92 79 - 97 fL   MCH 32.3 26.6 - 33.0 pg   MCHC 35.2 31.5 - 35.7 g/dL   RDW 12.6 12.3 - 15.4 %   Platelets 211 150 - 379 x10E3/uL   Neutrophils 39 Not Estab. %   Lymphs 43 Not Estab. %   Monocytes 13 Not Estab. %   Eos 4 Not Estab. %   Basos 1 Not Estab. %   Neutrophils Absolute 2.1 1.4 - 7.0 x10E3/uL   Lymphocytes Absolute 2.4 0.7 - 3.1 x10E3/uL   Monocytes Absolute 0.7 0.1 - 0.9 x10E3/uL   EOS (ABSOLUTE) 0.2 0.0 - 0.4 x10E3/uL    Basophils Absolute 0.0 0.0 - 0.2 x10E3/uL   Immature Granulocytes 0 Not Estab. %   Immature Grans (Abs) 0.0 0.0 - 0.1 x10E3/uL  Lipid Profile  Result Value Ref Range   Cholesterol, Total 202 (H) 100 - 199 mg/dL   Triglycerides 71 0 - 149 mg/dL   HDL 57 >39 mg/dL   VLDL Cholesterol Cal 14 5 - 40 mg/dL   LDL Calculated 131 (H) 0 - 99 mg/dL   Chol/HDL Ratio 3.5 0.0 - 5.0 ratio units  PSA  Result Value Ref Range   Prostate Specific Ag, Serum 2.2 0.0 - 4.0 ng/mL  Comprehensive Metabolic Panel (CMET)  Result Value Ref Range   Glucose 105 (H) 65 - 99 mg/dL   BUN 15 8 - 27 mg/dL   Creatinine, Ser 0.99 0.76 - 1.27 mg/dL   GFR calc non Af Amer 81 >59 mL/min/1.73   GFR calc Af Amer 94 >59 mL/min/1.73   BUN/Creatinine Ratio 15 10 - 24   Sodium 140 134 - 144 mmol/L   Potassium 4.9 3.5 - 5.2 mmol/L   Chloride 99 96 - 106 mmol/L   CO2 24 18 - 29 mmol/L   Calcium 9.4 8.6 - 10.2 mg/dL   Total Protein 7.0 6.0 - 8.5 g/dL   Albumin 4.5 3.6 - 4.8 g/dL   Globulin, Total 2.5 1.5 - 4.5 g/dL   Albumin/Globulin Ratio 1.8 1.2 - 2.2   Bilirubin Total 0.5 0.0 - 1.2 mg/dL   Alkaline Phosphatase 65 39 - 117 IU/L   AST 16 0 - 40 IU/L   ALT 24 0 - 44 IU/L  Hgb A1c w/o eAG  Result Value Ref Range   Hgb A1c MFr Bld 5.7 (H) 4.8 - 5.6 %  Specimen status report  Result Value Ref Range   specimen status report Comment       Assessment & Plan:   Problem List Items Addressed This Visit    Pre-diabetes    Last A1c 5.7, improving DM diet Never on meds No complications  Plan: 1. Check A1c through [facility] biometrics, review result 2. Follow-up 6-7 months repeat A1c for Annual Physical      Other male erectile dysfunction    Consistent with likely age-related ED, also with some mild HTN and elevated LDL (HLD), PreDM - no prior trial on PDE5 inhibitors  Plan: 1. Trial on generic Sildenafil 20mg  tabs, take 1-5 tabs about 30 min prior to sexual activity, refills provided 2. Follow-up  as needed - advised  to discuss this also with Urology in future if needs additional therapy or review other options      Relevant Medications   sildenafil (REVATIO) 20 MG tablet   Hyperlipidemia    Last lipids 05/2016 elevated LDL from 110s to 130 ASCVD 10 yr risk score from those results calculated at 12.1% risk  Plan: 1. Discussion today on ASCVD risk and reduction - agree to start ASA 81mg  daily, hold statin therapy 2. Improve lifestyle with exercise diet to reduce cholesterol first 3. 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 Continue current Lisinopril 10mg  - sent new rx 10mg  tabs instead of half 20mg  tabs 2. Check chemistry through outside [place], and again in 6 months 05/2017 for Annual physical 3. Encourage improve regular exercise, low sodium diet 4. Monitor BP at home or at drug store occasionally - to determine BP control for future management of meds - patient expresses desire to be off anti-HTN meds if possible 5. Follow-up 6-7 months annual physical, sooner if BP uncontrolled      Relevant Medications   lisinopril (PRINIVIL,ZESTRIL) 10 MG tablet   sildenafil (REVATIO) 20 MG tablet   Elevated PSA    Followed by Urology ([facility]) only mild elevation previously 1.2 to 2.2, due for repeat PSA soon - No changes in symptoms at this time - Suspect this is mild BPH, seems to have minimal LUTS as well but does endorse ED       Other Visit Diagnoses    Annual physical exam    -  Primary      Meds ordered this  encounter  Medications  . lisinopril (PRINIVIL,ZESTRIL) 10 MG tablet    Sig: Take 1 tablet (10 mg total) by mouth daily.    Dispense:  90 tablet    Refill:  3  . sildenafil (REVATIO) 20 MG tablet    Sig: Take 1-5 pills about 30 min prior to sex. Start with 1 and increase as needed.    Dispense:  20 tablet    Refill:  3     Follow up plan: Return in about 7 months (around 05/12/2017) for blood pressure.  Nobie Putnam, [facility] 10/10/2016, 4:26 PM

## 2016-10-10 NOTE — Assessment & Plan Note (Signed)
Consistent with likely age-related ED, also with some mild HTN and elevated LDL (HLD), PreDM - no prior trial on PDE5 inhibitors  Plan: 1. Trial on generic Sildenafil 20mg  tabs, take 1-5 tabs about 30 min prior to sexual activity, refills provided 2. Follow-up as needed - advised to discuss this also with Urology in future if needs additional therapy or review other options

## 2016-10-11 ENCOUNTER — Other Ambulatory Visit: Payer: Self-pay | Admitting: *Deleted

## 2016-10-11 DIAGNOSIS — N528 Other male erectile dysfunction: Secondary | ICD-10-CM

## 2016-10-11 MED ORDER — SILDENAFIL CITRATE 20 MG PO TABS: ORAL_TABLET | ORAL | 3 refills | Status: DC

## 2016-11-14 ENCOUNTER — Other Ambulatory Visit: Payer: 59

## 2016-11-14 DIAGNOSIS — R972 Elevated prostate specific antigen [PSA]: Secondary | ICD-10-CM

## 2016-11-15 LAB — PSA: PROSTATE SPECIFIC AG, SERUM: 2.1 ng/mL (ref 0.0–4.0)

## 2016-11-18 ENCOUNTER — Telehealth: Payer: Self-pay | Admitting: Family Medicine

## 2016-11-18 ENCOUNTER — Encounter: Payer: Self-pay | Admitting: Urology

## 2016-11-18 ENCOUNTER — Ambulatory Visit (INDEPENDENT_AMBULATORY_CARE_PROVIDER_SITE_OTHER): Payer: 59 | Admitting: Urology

## 2016-11-18 VITALS — BP 159/94 | HR 96 | Ht 72.0 in | Wt 220.9 lb

## 2016-11-18 DIAGNOSIS — I10 Essential (primary) hypertension: Secondary | ICD-10-CM

## 2016-11-18 DIAGNOSIS — R972 Elevated prostate specific antigen [PSA]: Secondary | ICD-10-CM | POA: Diagnosis not present

## 2016-11-18 MED ORDER — LISINOPRIL 10 MG PO TABS: 10.0000 mg | ORAL_TABLET | Freq: Every day | ORAL | 3 refills | Status: DC

## 2016-11-18 NOTE — Telephone Encounter (Signed)
Pt needs a refill on lisinopril sent to [facility].  His call back number is [phone]

## 2016-11-18 NOTE — Progress Notes (Signed)
11/18/2016 2:51 PM   Zachary Wall [birth year] [id number]  Referring provider: Arlis Porta., MD No address on file  Chief Complaint  Patient presents with  . Elevated PSA    6 month follow up    HPI: Zachary Wall is a 66yo seen for evaluation today for rising PSA. His PSA on referral is 2.2  His PSA 1 year ago was 1.4.  He has mild LUTS with occasional nocturia 1x. No hematuria, no dysuria. No hx of UTI or prostate infections. No hx of prostate biopsy. No family hx of prostate cancer. No other associated symptoms. No exacerbating/alleviating events.   Interval: The patient presents today for follow-up of anelevated/rising PSA. Over the interval of the past 6 months he has had no progression of his urinary tract symptoms. Specifically he denies any dysuria or gross hematuria, frequency, urgency, or significant nocturia.  The patient has no family history of prostate cancer.  PMH: Past Medical History:  Diagnosis Date  . Cancer [facility])    skin    Surgical History: Past Surgical History:  Procedure Laterality Date  . SKIN CANCER EXCISION      Home Medications:  Allergies as of 11/18/2016      Reactions   Azithromycin Rash      Medication List       Accurate as of 11/18/16  2:51 PM. Always use your most recent med list.          lisinopril 10 MG tablet Commonly known as:  PRINIVIL,ZESTRIL Take 1 tablet (10 mg total) by mouth daily.   ranitidine 150 MG capsule Commonly known as:  ZANTAC Take 150 mg by mouth daily as needed.   sildenafil 20 MG tablet Commonly known as:  REVATIO Take 1-5 pills about 30 min prior to sex. Start with 1 and increase as needed.       Allergies:  Allergies  Allergen Reactions  . Azithromycin Rash    Family History: Family History  Problem Relation Age of Onset  . Hematuria Neg Hx   . Prostate cancer Neg Hx   . Tuberculosis Neg Hx   . Kidney cancer Neg Hx   . Bladder Cancer Neg Hx     Social History:  reports that  he has never smoked. He has never used smokeless tobacco. He reports that he does not drink alcohol or use drugs.  ROS: UROLOGY Frequent Urination?: No Hard to postpone urination?: No Burning/pain with urination?: No Get up at night to urinate?: No Leakage of urine?: No Urine stream starts and stops?: No Trouble starting stream?: No Do you have to strain to urinate?: No Blood in urine?: No Urinary tract infection?: No Sexually transmitted disease?: No Injury to kidneys or bladder?: No Painful intercourse?: No Weak stream?: No Erection problems?: No Penile pain?: No  Gastrointestinal Nausea?: No Vomiting?: No Indigestion/heartburn?: No Diarrhea?: No Constipation?: No  Constitutional Fever: No Night sweats?: No Weight loss?: No Fatigue?: No  Skin Skin rash/lesions?: No Itching?: No  Eyes Blurred vision?: No Double vision?: No  Ears/Nose/Throat Sore throat?: No Sinus problems?: No  Hematologic/Lymphatic Swollen glands?: No Easy bruising?: No  Cardiovascular Leg swelling?: No Chest pain?: No  Respiratory Cough?: No Shortness of breath?: No  Endocrine Excessive thirst?: No  Musculoskeletal Back pain?: No Joint pain?: No  Neurological Headaches?: No Dizziness?: No  Psychologic Depression?: No Anxiety?: No  Physical Exam: BP (!) 159/94   Pulse 96   Ht 6' (1.829 m)   Wt 100.2 kg (  220 lb 14.4 oz)   BMI 29.96 kg/m   Constitutional:  Alert and oriented, No acute distress. HEENT: [place] AT, moist mucus membranes.  Trachea midline, no masses. Cardiovascular: No clubbing, cyanosis, or edema. Respiratory: Normal respiratory effort, no increased work of breathing. GI: Abdomen is soft, nontender, nondistended, no abdominal masses GU: No CVA tenderness. Circumcised phallus. No masses/lesiosn on penis/testes/scrotum. DRE: 11/17- Prostate 30g smooth, no induration, no nodules Skin: No rashes, bruises or suspicious lesions. Lymph: No cervical or inguinal  adenopathy. Neurologic: Grossly intact, no focal deficits, moving all 4 extremities. Psychiatric: Normal mood and affect.  Laboratory Data: Lab Results  Component Value Date   WBC 5.4 05/02/2016   HCT 45.8 05/02/2016   MCV 92 05/02/2016   PLT 211 05/02/2016    Lab Results  Component Value Date   CREATININE 0.99 05/02/2016    No results found for: PSA  No results found for: TESTOSTERONE  Lab Results  Component Value Date   HGBA1C 5.7 (H) 05/02/2016    Urinalysis No results found for: COLORURINE, APPEARANCEUR, LABSPEC, PHURINE, GLUCOSEU, HGBUR, BILIRUBINUR, KETONESUR, PROTEINUR, UROBILINOGEN, NITRITE, LEUKOCYTESUR  Pertinent Imaging: none  Assessment & Plan:    1. The patient's PSA has gone from 2.2 to 2.1 over the past 6 months. He has no significant risk factors for prostate cancer including family history and a normal DRE. The patient needs annual prostate cancer screening, he needs no additional follow-up with urology at this time.  Cc: Dr. Larene Beach, M.D.  There are no diagnoses linked to this encounter.  No Follow-up on file.  Ardis Hughs, [facility] [address] [place], [place] [address] [phone]

## 2016-11-18 NOTE — Telephone Encounter (Signed)
Refilled Lisinopril 10mg  daily sent #90 +3 refills to [facility], mail order, as it had recently been sent to [facility] 11/18/2016, 3:33 PM

## 2017-03-05 ENCOUNTER — Other Ambulatory Visit: Payer: Self-pay | Admitting: Family Medicine

## 2017-03-05 DIAGNOSIS — E7800 Pure hypercholesterolemia, unspecified: Secondary | ICD-10-CM

## 2017-03-05 DIAGNOSIS — R7303 Prediabetes: Secondary | ICD-10-CM

## 2017-03-05 DIAGNOSIS — Z114 Encounter for screening for human immunodeficiency virus [HIV]: Secondary | ICD-10-CM

## 2017-03-05 DIAGNOSIS — I10 Essential (primary) hypertension: Secondary | ICD-10-CM

## 2017-03-05 DIAGNOSIS — Z1159 Encounter for screening for other viral diseases: Secondary | ICD-10-CM

## 2017-04-22 LAB — LIPID PANEL
CHOL/HDL RATIO: 3.8 ratio (ref 0.0–5.0)
Cholesterol, Total: 189 mg/dL (ref 100–199)
HDL: 50 mg/dL (ref >39)
LDL Calculated: 124 mg/dL — ABNORMAL HIGH (ref 0–99)
Triglycerides: 75 mg/dL (ref 0–149)
VLDL Cholesterol Cal: 15 mg/dL (ref 5–40)

## 2017-04-22 LAB — CBC WITH DIFFERENTIAL/PLATELET
BASOS ABS: 0 10*3/uL (ref 0.0–0.2)
Basos: 0 %
EOS (ABSOLUTE): 0.2 10*3/uL (ref 0.0–0.4)
Eos: 3 %
HEMATOCRIT: 44.7 % (ref 37.5–51.0)
Hemoglobin: 15.4 g/dL (ref 13.0–17.7)
Immature Grans (Abs): 0 10*3/uL (ref 0.0–0.1)
Immature Granulocytes: 0 %
LYMPHS ABS: 1.3 10*3/uL (ref 0.7–3.1)
Lymphs: 22 %
MCH: 32.3 pg (ref 26.6–33.0)
MCHC: 34.5 g/dL (ref 31.5–35.7)
MCV: 94 fL (ref 79–97)
MONOS ABS: 0.5 10*3/uL (ref 0.1–0.9)
Monocytes: 8 %
Neutrophils Absolute: 3.9 10*3/uL (ref 1.4–7.0)
Neutrophils: 67 %
Platelets: 209 10*3/uL (ref 150–379)
RBC: 4.77 x10E6/uL (ref 4.14–5.80)
RDW: 12.4 % (ref 12.3–15.4)
WBC: 5.9 10*3/uL (ref 3.4–10.8)

## 2017-04-22 LAB — COMPREHENSIVE METABOLIC PANEL
A/G RATIO: 1.9 (ref 1.2–2.2)
ALT: 18 IU/L (ref 0–44)
AST: 16 IU/L (ref 0–40)
Albumin: 4.3 g/dL (ref 3.6–4.8)
Alkaline Phosphatase: 63 IU/L (ref 39–117)
BUN/Creatinine Ratio: 16 (ref 10–24)
BUN: 15 mg/dL (ref 8–27)
Bilirubin Total: 0.3 mg/dL (ref 0.0–1.2)
CALCIUM: 9.2 mg/dL (ref 8.6–10.2)
CO2: 24 mmol/L (ref 20–29)
Chloride: 102 mmol/L (ref 96–106)
Creatinine, Ser: 0.91 mg/dL (ref 0.76–1.27)
GFR, EST AFRICAN AMERICAN: 103 mL/min/{1.73_m2} (ref >59)
GFR, EST NON AFRICAN AMERICAN: 89 mL/min/{1.73_m2} (ref >59)
GLOBULIN, TOTAL: 2.3 g/dL (ref 1.5–4.5)
Glucose: 117 mg/dL — ABNORMAL HIGH (ref 65–99)
POTASSIUM: 4.2 mmol/L (ref 3.5–5.2)
SODIUM: 140 mmol/L (ref 134–144)
TOTAL PROTEIN: 6.6 g/dL (ref 6.0–8.5)

## 2017-04-22 LAB — HIV ANTIBODY (ROUTINE TESTING W REFLEX): HIV Screen 4th Generation wRfx: NONREACTIVE

## 2017-04-22 LAB — HEPATITIS C ANTIBODY: Hep C Virus Ab: <0.1 s/co ratio (ref 0.0–0.9)

## 2017-04-22 LAB — HEMOGLOBIN A1C
ESTIMATED AVERAGE GLUCOSE: 114 mg/dL
HEMOGLOBIN A1C: 5.6 % (ref 4.8–5.6)

## 2017-05-05 ENCOUNTER — Encounter: Payer: Self-pay | Admitting: Family Medicine

## 2017-05-05 ENCOUNTER — Ambulatory Visit (INDEPENDENT_AMBULATORY_CARE_PROVIDER_SITE_OTHER): Payer: 59 | Admitting: Family Medicine

## 2017-05-05 ENCOUNTER — Other Ambulatory Visit: Payer: Self-pay | Admitting: Family Medicine

## 2017-05-05 VITALS — BP 132/80 | HR 70 | Temp 98.3°F | Resp 16 | Ht 71.0 in | Wt 220.0 lb

## 2017-05-05 DIAGNOSIS — I10 Essential (primary) hypertension: Secondary | ICD-10-CM | POA: Diagnosis not present

## 2017-05-05 DIAGNOSIS — R7303 Prediabetes: Secondary | ICD-10-CM

## 2017-05-05 DIAGNOSIS — Z0000 Encounter for general adult medical examination without abnormal findings: Secondary | ICD-10-CM

## 2017-05-05 DIAGNOSIS — E7800 Pure hypercholesterolemia, unspecified: Secondary | ICD-10-CM | POA: Diagnosis not present

## 2017-05-05 MED ORDER — ASPIRIN EC 81 MG PO TBEC: 81.0000 mg | DELAYED_RELEASE_TABLET | Freq: Every day | ORAL | Status: AC

## 2017-05-05 NOTE — Assessment & Plan Note (Signed)
Well-controlled Home readings normal, infrequent No known complications   Plan:  1. Continue current Lisinopril 10mg  2. Encourage improve regular exercise, low sodium diet 3. Continue monitor BP at home or at drug store occasionally 4. Follow-up 6 months Annual Phys + [facility] labs

## 2017-05-05 NOTE — Patient Instructions (Addendum)
Thank you for coming to the clinic today.  1. Keep up the great work overall!  2. No medication changes  3.  We discussed Statin Cholesterol medicine again, we can re-consider this in the future if needed based on cardiovascular risk and cholesterol readings. Hold for now.  DUE for FASTING BLOOD WORK (no food or drink after midnight before the lab appointment, only water or coffee without cream/sugar on the morning of)  DUE for blood work at [facility] in [place]   We will try to contact you about 2-4 weeks before your apt, and you may come by to pick up [facility] Orders  For Lab Results, once available within 2-3 days of blood draw, you can can log in to MyChart online to view your results and a brief explanation. Also, we can discuss results at next follow-up visit.  Please schedule a Follow-up Appointment to: Return in about 6 months (around 11/02/2017) for Annual Physical.  If you have any other questions or concerns, please feel free to call the clinic or send a message through [place]. You may also schedule an earlier appointment if necessary.  Additionally, you may be receiving a survey about your experience at our clinic within a few days to 1 week by e-mail or mail. We value your feedback.  Nobie Putnam, DO [place]

## 2017-05-05 NOTE — Assessment & Plan Note (Signed)
Mostly controlled cholesterol on lifestyle Last lipid panel 03/2017 Calculated ASCVD 10 yr risk score 11%  Plan: 1. Reviewed ASCVD risk and offered statin therapy - patient declines at this time, prefers to only take ASA for now and improve lifestyle, understands counseling on benefits and risk reduction of statin 2. Continue ASA 81mg  for primary ASCVD risk reduction 3. Encourage improved lifestyle - low carb/cholesterol, reduce portion size, continue improving regular exercise 4. Follow-up 6 months annual phys + lipids

## 2017-05-05 NOTE — Assessment & Plan Note (Signed)
Well-controlled Pre-DM with A1c 5.6 (stable to improved from 5.7)  Plan:  1. Not on any therapy currently - continue 2. Encourage improved lifestyle - low carb, low sugar diet, reduce portion size, continue improving regular exercise 3. Follow-up 6 months annual + labs

## 2017-05-05 NOTE — Progress Notes (Signed)
Subjective:    Patient ID: Zachary Wall, male    DOB: 28-Jul-1950, 66 y.o.   MRN: [id number]  Zachary Wall is a 66 y.o. male presenting on 05/05/2017 for Hypertension and Pre-Diabetes   HPI   CHRONIC HTN: Son has BP cuff checks occasionally q 1-2 months - Last visit with me 09/2016, for same problem, treated with adjusted BP med from Lisinopril 20mg  HALF tab to whole tab 10mg  but no dose change, see prior notes for background information. - Interval update with checks BP outside office from son's BP cuff occasionally q 1-2 months, normal readings - Today patient reports no new concerns. Believes BP higher initially in office usually Current Meds - Lisinopril 10mg  Reports good compliance, took meds today. Tolerating well, w/o complaints. Lifestyle: (see below) - Drinks caffeine with diet mnt dew still, trying to reduce  Pre-Diabetes / OBESITY BMI >30 - Last visit with me 09/2016, for same problem, see prior notes for background information. - Interval update with last A1c 5.6 (improved) - Today patient reports no new concerns Lifestyle: - Weight down 1-2 lbs in 6 months - Diet: reduced carbs and sweets, trying to improve diet still no major changes since last visit, drinks mostly water, some diet mtn new - Exercise: Still with walking and bike riding, less during winter  HYPERLIPIDEMIA: - Reports no concerns. Last lipid panel 03/2017, mostly unchanged still controlled with slightly elevated LDL - Currently not on Statin, tolerating well without side effects or myalgias - Taking ASA EC 81mg  daily since last visit, tolerating well - No known CVD or MI in family history  Health Maintenance: - Due for Flu Shot, declines today despite counseling on benefits  Depression screen [facility] 2/9 06/11/2016 04/30/2016 12/13/2014  Decreased Interest 0 0 0  Down, Depressed, Hopeless 0 0 0  PHQ - 2 Score 0 0 0    Social History   Tobacco Use  . Smoking status: Never Smoker  . Smokeless  tobacco: Never Used  Substance Use Topics  . Alcohol use: No  . Drug use: No    Review of Systems Per HPI unless specifically indicated above     Objective:    BP 132/80 (BP Location: Left Arm, Cuff Size: Normal)   Pulse 70   Temp 98.3 F (36.8 C) (Oral)   Resp 16   Ht 5\' 11"  (1.803 m)   Wt 220 lb (99.8 kg)   BMI 30.68 kg/m   Wt Readings from Last 3 Encounters:  05/05/17 220 lb (99.8 kg)  11/18/16 220 lb 14.4 oz (100.2 kg)  10/10/16 222 lb (100.7 kg)    Physical Exam  Constitutional: He is oriented to person, place, and time. He appears well-developed and well-nourished. No distress.  Well-appearing, comfortable, cooperative  HENT:  Head: Normocephalic and atraumatic.  Mouth/Throat: Oropharynx is clear and moist.  Eyes: Conjunctivae are normal. Right eye exhibits no discharge. Left eye exhibits no discharge.  Neck: Normal range of motion. Neck supple. No thyromegaly present.  Cardiovascular: Normal rate, regular rhythm, normal heart sounds and intact distal pulses.  No murmur heard. Pulmonary/Chest: Effort normal and breath sounds normal. No respiratory distress. He has no wheezes. He has no rales.  Musculoskeletal: Normal range of motion. He exhibits no edema.  Lymphadenopathy:    He has no cervical adenopathy.  Neurological: He is alert and oriented to person, place, and time.  Skin: Skin is warm and dry. No rash noted. He is not diaphoretic. No erythema.  Psychiatric:  He has a normal mood and affect. His behavior is normal.  Well groomed, good eye contact, normal speech and thoughts  Nursing note and vitals reviewed.  Results for orders placed or performed in visit on 03/05/17  Comprehensive metabolic panel  Result Value Ref Range   Glucose 117 (H) 65 - 99 mg/dL   BUN 15 8 - 27 mg/dL   Creatinine, Ser 0.91 0.76 - 1.27 mg/dL   GFR calc non Af Amer 89 >59 mL/min/1.73   GFR calc Af Amer 103 >59 mL/min/1.73   BUN/Creatinine Ratio 16 10 - 24   Sodium 140 134 - 144  mmol/L   Potassium 4.2 3.5 - 5.2 mmol/L   Chloride 102 96 - 106 mmol/L   CO2 24 20 - 29 mmol/L   Calcium 9.2 8.6 - 10.2 mg/dL   Total Protein 6.6 6.0 - 8.5 g/dL   Albumin 4.3 3.6 - 4.8 g/dL   Globulin, Total 2.3 1.5 - 4.5 g/dL   Albumin/Globulin Ratio 1.9 1.2 - 2.2   Bilirubin Total 0.3 0.0 - 1.2 mg/dL   Alkaline Phosphatase 63 39 - 117 IU/L   AST 16 0 - 40 IU/L   ALT 18 0 - 44 IU/L  Lipid panel  Result Value Ref Range   Cholesterol, Total 189 100 - 199 mg/dL   Triglycerides 75 0 - 149 mg/dL   HDL 50 >39 mg/dL   VLDL Cholesterol Cal 15 5 - 40 mg/dL   LDL Calculated 124 (H) 0 - 99 mg/dL   Chol/HDL Ratio 3.8 0.0 - 5.0 ratio  Hemoglobin A1c  Result Value Ref Range   Hgb A1c MFr Bld 5.6 4.8 - 5.6 %   Est. average glucose Bld gHb Est-mCnc 114 mg/dL  CBC with Differential/Platelet  Result Value Ref Range   WBC 5.9 3.4 - 10.8 x10E3/uL   RBC 4.77 4.14 - 5.80 x10E6/uL   Hemoglobin 15.4 13.0 - 17.7 g/dL   Hematocrit 44.7 37.5 - 51.0 %   MCV 94 79 - 97 fL   MCH 32.3 26.6 - 33.0 pg   MCHC 34.5 31.5 - 35.7 g/dL   RDW 12.4 12.3 - 15.4 %   Platelets 209 150 - 379 x10E3/uL   Neutrophils 67 Not Estab. %   Lymphs 22 Not Estab. %   Monocytes 8 Not Estab. %   Eos 3 Not Estab. %   Basos 0 Not Estab. %   Neutrophils Absolute 3.9 1.4 - 7.0 x10E3/uL   Lymphocytes Absolute 1.3 0.7 - 3.1 x10E3/uL   Monocytes Absolute 0.5 0.1 - 0.9 x10E3/uL   EOS (ABSOLUTE) 0.2 0.0 - 0.4 x10E3/uL   Basophils Absolute 0.0 0.0 - 0.2 x10E3/uL   Immature Granulocytes 0 Not Estab. %   Immature Grans (Abs) 0.0 0.0 - 0.1 x10E3/uL  Hepatitis C antibody  Result Value Ref Range   Hep C Virus Ab <0.1 0.0 - 0.9 s/co ratio  HIV antibody  Result Value Ref Range   HIV Screen 4th Generation wRfx Non Reactive Non Reactive      Assessment & Plan:   Problem List Items Addressed This Visit    Essential hypertension - Primary    Well-controlled Home readings normal, infrequent No known complications   Plan:  1.  Continue current Lisinopril 10mg  2. Encourage improve regular exercise, low sodium diet 3. Continue monitor BP at home or at drug store occasionally 4. Follow-up 6 months Annual Phys + [facility] labs      Relevant Medications   aspirin EC  81 MG tablet   Hyperlipidemia    Mostly controlled cholesterol on lifestyle Last lipid panel 03/2017 Calculated ASCVD 10 yr risk score 11%  Plan: 1. Reviewed ASCVD risk and offered statin therapy - patient declines at this time, prefers to only take ASA for now and improve lifestyle, understands counseling on benefits and risk reduction of statin 2. Continue ASA 81mg  for primary ASCVD risk reduction 3. Encourage improved lifestyle - low carb/cholesterol, reduce portion size, continue improving regular exercise 4. Follow-up 6 months annual phys + lipids      Relevant Medications   aspirin EC 81 MG tablet   Pre-diabetes    Well-controlled Pre-DM with A1c 5.6 (stable to improved from 5.7)  Plan:  1. Not on any therapy currently - continue 2. Encourage improved lifestyle - low carb, low sugar diet, reduce portion size, continue improving regular exercise 3. Follow-up 6 months annual + labs         Meds ordered this encounter  Medications  . aspirin EC 81 MG tablet    Sig: Take 1 tablet (81 mg total) daily by mouth.    Follow up plan: Return in about 6 months (around 11/02/2017) for Annual Physical.  Future [facility] orders placed for 10/2017, patient to be notified 2-4 weeks before physical and we will release his future orders, he can pick up and get drawn at [facility] 05/05/2017, 5:53 PM

## 2017-09-08 ENCOUNTER — Other Ambulatory Visit: Payer: Self-pay | Admitting: Family Medicine

## 2017-09-08 DIAGNOSIS — I10 Essential (primary) hypertension: Secondary | ICD-10-CM

## 2017-11-12 ENCOUNTER — Encounter: Payer: Self-pay | Admitting: Family Medicine

## 2017-11-12 ENCOUNTER — Ambulatory Visit (INDEPENDENT_AMBULATORY_CARE_PROVIDER_SITE_OTHER): Payer: Managed Care, Other (non HMO) | Admitting: Family Medicine

## 2017-11-12 VITALS — BP 131/76 | HR 66 | Temp 98.4°F | Resp 16 | Ht 72.0 in | Wt 226.6 lb

## 2017-11-12 DIAGNOSIS — E7800 Pure hypercholesterolemia, unspecified: Secondary | ICD-10-CM

## 2017-11-12 DIAGNOSIS — I10 Essential (primary) hypertension: Secondary | ICD-10-CM | POA: Diagnosis not present

## 2017-11-12 DIAGNOSIS — N528 Other male erectile dysfunction: Secondary | ICD-10-CM | POA: Diagnosis not present

## 2017-11-12 DIAGNOSIS — R972 Elevated prostate specific antigen [PSA]: Secondary | ICD-10-CM | POA: Diagnosis not present

## 2017-11-12 DIAGNOSIS — R7303 Prediabetes: Secondary | ICD-10-CM

## 2017-11-12 DIAGNOSIS — Z0000 Encounter for general adult medical examination without abnormal findings: Secondary | ICD-10-CM

## 2017-11-12 MED ORDER — VALSARTAN 40 MG PO TABS: 40.0000 mg | ORAL_TABLET | Freq: Every day | ORAL | 3 refills | Status: DC

## 2017-11-12 MED ORDER — SILDENAFIL CITRATE 20 MG PO TABS: ORAL_TABLET | ORAL | 5 refills | Status: DC

## 2017-11-12 NOTE — Assessment & Plan Note (Signed)
Due for repeat A1c now Previously well-controlled Pre-DM with A1c 5.6 Concern with obesity, HTN, HLD  Plan:  1. Not on any therapy currently  2. Encourage improved lifestyle - low carb, low sugar diet, reduce portion size, continue improving regular exercise 3. Follow-up 6 months A1c - pending current result, if abnormal may return sooner as 3 months

## 2017-11-12 NOTE — Assessment & Plan Note (Signed)
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 Refill generic Sildenafil 20mg  tabs, take 3-5 tabs about 30 min prior to sexual activity, refills provided - printed 2. Follow-up as needed - was followed by urology but did not need further assistance, may return to [facility] in future if need

## 2017-11-12 NOTE — Assessment & Plan Note (Signed)
Due for fasting lipids now Previously he had mostly controlled cholesterol on lifestyle Last lipid panel 03/2017 Calculated ASCVD 10 yr risk score 11%  Plan: 1. Reviewed ASCVD risk and offered statin therapy - patient declines again at this time, prefers to only take ASA for now and improve lifestyle, understands counseling on benefits and risk reduction of statin - reconsider in future pending inc risk and lipid results 2. Continue ASA 81mg  for primary ASCVD risk reduction 3. Encourage improved lifestyle - low carb/cholesterol, reduce portion size, continue improving regular exercise 4. Follow-up yearly lipids

## 2017-11-12 NOTE — Patient Instructions (Addendum)
Thank you for coming to the office today.  STOP Lisinopril - due to cough  START Valsartan (Diovan) 40mg  daily - sent [facility] mail order  Refilled Sildenafil  Stay tuned for blood sugar A1c result  Keep trying to improve lifestyle, diet, exercise  DUE for FASTING BLOOD WORK (no food or drink after midnight before the lab appointment, only water or coffee without cream/sugar on the morning of)  [facility] within 1 week  For Lab Results, once available within 2-3 days of blood draw, you can can log in to MyChart online to view your results and a brief explanation. Also, we can discuss results at next follow-up visit.   Please schedule a Follow-up Appointment to: Return in about 6 months (around 05/15/2018) for PreDM A1c, HTN.  If you have any other questions or concerns, please feel free to call the office or send a message through [place]. You may also schedule an earlier appointment if necessary.  Additionally, you may be receiving a survey about your experience at our office within a few days to 1 week by e-mail or mail. We value your feedback.  Nobie Putnam, DO [place]

## 2017-11-12 NOTE — Progress Notes (Signed)
Subjective:    Patient ID: Zachary Wall, male    DOB: 03-Aug-1950, 67 y.o.   MRN: [id number]  Zachary Wall is a 67 y.o. male presenting on 11/12/2017 for Annual Exam   HPI   Here for Annual Physical - he is due for fasting labs, will go to [facility] soon, request orders today.  CHRONIC HTN: Home BP normal readings, doesn't have list here today History now of dry recurrent cough worse over past 1 month, but in past has had this issue before, he thinks may be due to Lisinopril medicine. Previously lowered on lisinopril from 20 to 10mg  - he had some URI recently as well - Today patient reports no new concerns except cough - wants to switch med. Believes BP higher initially in office usually Current Meds -Lisinopril 10mg  Reports good compliance, took meds today. Tolerating well, w/o complaints. Lifestyle: (see below) - Drinks caffeine with diet mnt dew still, trying to reduce  Pre-Diabetes/ OBESITY BMI >30 - Last visit with me 05/2017, for same problem, see prior notes for background information. - Today patient reports no new concerns. Due for labs and A1c. Last one 5.6 Lifestyle: - Admits weight gain +6 lbs due to sedentary over winter, now more active in warmer weather - usually drops weight in summer, stay active - Diet: reduced carbs and sweets, trying to improve diet still no major changes since last visit, drinks mostly water, some diet mtn dew - Exercise: goal to inc bike and walking  Erectile Dysfunction Stable chronic problem. He takes Sildenafil up to 3 pills 20mg  each PRN with good results, request refill today. Printed to take to [place]: - Reports no concerns. Last lipid panel 03/2017, mostly unchanged still controlled with slightly elevated LDL - Currently not on Statin, tolerating well without side effects or myalgias - Taking ASA EC 81mg  daily since last visit, tolerating well - he does admit some mild easy bleeding, but doing  fine - No known CVD or MI in family history   Health Maintenance:  Prostate CA Screening: Previously followed by [facility] due to elevated PSA in past from 1 to 2 by chart review, prior DRE 05/2016 by urology showed 30g prostate mild enlarge, smooth without nodules. Last PSA 2.1 (11/14/16) stable from last 2.2. Currently asymptomatic without LUTS. No known family history of prostate CA. Due for screening PSA - with upcoming labs.  UTD colonoscopy - last 2016, per Dr Gaylyn Cheers - scanned into chart  UTD Hep C and HIV routine screening, negative 03/2017   Depression screen [facility] 2/9 11/12/2017 06/11/2016 04/30/2016  Decreased Interest 0 0 0  Down, Depressed, Hopeless 0 0 0  PHQ - 2 Score 0 0 0    Past Medical History:  Diagnosis Date  . Cancer [facility])    skin   Past Surgical History:  Procedure Laterality Date  . SKIN CANCER EXCISION     Social History   Socioeconomic History  . Marital status: Married    Spouse name: Not on file  . Number of children: Not on file  . Years of education: Not on file  . Highest education level: Not on file  Occupational History  . Not on file  Social Needs  . Financial resource strain: Not on file  . Food insecurity:    Worry: Not on file    Inability: Not on file  . Transportation needs:    Medical: Not on file    Non-medical: Not  on file  Tobacco Use  . Smoking status: Never Smoker  . Smokeless tobacco: Never Used  Substance and Sexual Activity  . Alcohol use: No  . Drug use: No  . Sexual activity: Yes  Lifestyle  . Physical activity:    Days per week: Not on file    Minutes per session: Not on file  . Stress: Not on file  Relationships  . Social connections:    Talks on phone: Not on file    Gets together: Not on file    Attends religious service: Not on file    Active member of club or organization: Not on file    Attends meetings of clubs or organizations: Not on file    Relationship status: Not on file  . Intimate  partner violence:    Fear of current or ex partner: Not on file    Emotionally abused: Not on file    Physically abused: Not on file    Forced sexual activity: Not on file  Other Topics Concern  . Not on file  Social History Narrative  . Not on file   Family History  Problem Relation Age of Onset  . Hematuria Neg Hx   . Prostate cancer Neg Hx   . Tuberculosis Neg Hx   . Kidney cancer Neg Hx   . Bladder Cancer Neg Hx    Current Outpatient Medications on File Prior to Visit  Medication Sig  . aspirin EC 81 MG tablet Take 1 tablet (81 mg total) daily by mouth.  . ranitidine (ZANTAC) 150 MG capsule Take 150 mg by mouth daily as needed.    No current facility-administered medications on file prior to visit.     Review of Systems  Constitutional: Negative for activity change, appetite change, chills, diaphoresis, fatigue and fever.  HENT: Negative for congestion and hearing loss.   Eyes: Negative for visual disturbance.  Respiratory: Negative for apnea, cough, choking, chest tightness, shortness of breath and wheezing.   Cardiovascular: Negative for chest pain, palpitations and leg swelling.  Gastrointestinal: Negative for abdominal distention, abdominal pain, anal bleeding, blood in stool, constipation, diarrhea, nausea and vomiting.  Endocrine: Negative for cold intolerance.  Genitourinary: Negative for decreased urine volume, difficulty urinating, dysuria, frequency, hematuria, testicular pain and urgency.  Musculoskeletal: Negative for arthralgias, back pain and neck pain.  Skin: Negative for rash.  Allergic/Immunologic: Negative for environmental allergies.  Neurological: Negative for dizziness, weakness, light-headedness, numbness and headaches.  Hematological: Negative for adenopathy. Bruises/bleeds easily.  Psychiatric/Behavioral: Negative for behavioral problems, dysphoric mood and sleep disturbance. The patient is not nervous/anxious.    Per HPI unless specifically  indicated above     Objective:    BP 131/76   Pulse 66   Temp 98.4 F (36.9 C) (Oral)   Resp 16   Ht 6' (1.829 m)   Wt 226 lb 9.6 oz (102.8 kg)   BMI 30.73 kg/m   Wt Readings from Last 3 Encounters:  11/12/17 226 lb 9.6 oz (102.8 kg)  05/05/17 220 lb (99.8 kg)  11/18/16 220 lb 14.4 oz (100.2 kg)    Physical Exam  Constitutional: He is oriented to person, place, and time. He appears well-developed and well-nourished. No distress.  Well-appearing, comfortable, cooperative, overweight  HENT:  Head: Normocephalic and atraumatic.  Mouth/Throat: Oropharynx is clear and moist.  Frontal / maxillary sinuses non-tender. Nares patent without purulence or edema. Bilateral TMs clear without erythema, effusion or bulging. Oropharynx clear without erythema, exudates, edema or asymmetry.  Eyes: Pupils are equal, round, and reactive to light. Conjunctivae and EOM are normal. Right eye exhibits no discharge. Left eye exhibits no discharge.  Neck: Normal range of motion. Neck supple. No thyromegaly present.  Cardiovascular: Normal rate, regular rhythm, normal heart sounds and intact distal pulses.  No murmur heard. Pulmonary/Chest: Effort normal and breath sounds normal. No respiratory distress. He has no wheezes. He has no rales.  Abdominal: Soft. Bowel sounds are normal. He exhibits no distension and no mass. There is no tenderness.  Musculoskeletal: Normal range of motion. He exhibits no edema or tenderness.  Upper / Lower Extremities: - Normal muscle tone, strength bilateral upper extremities 5/5, lower extremities 5/5  Lymphadenopathy:    He has no cervical adenopathy.  Neurological: He is alert and oriented to person, place, and time.  Distal sensation intact to light touch all extremities  Skin: Skin is warm and dry. No rash noted. He is not diaphoretic. No erythema.  Chronic dry skin with sun damage, several areas prior cryotherapy per Derm  Psychiatric: He has a normal mood and affect.  His behavior is normal.  Well groomed, good eye contact, normal speech and thoughts  Nursing note and vitals reviewed.  Results for orders placed or performed in visit on 03/05/17  Comprehensive metabolic panel  Result Value Ref Range   Glucose 117 (H) 65 - 99 mg/dL   BUN 15 8 - 27 mg/dL   Creatinine, Ser 0.91 0.76 - 1.27 mg/dL   GFR calc non Af Amer 89 >59 mL/min/1.73   GFR calc Af Amer 103 >59 mL/min/1.73   BUN/Creatinine Ratio 16 10 - 24   Sodium 140 134 - 144 mmol/L   Potassium 4.2 3.5 - 5.2 mmol/L   Chloride 102 96 - 106 mmol/L   CO2 24 20 - 29 mmol/L   Calcium 9.2 8.6 - 10.2 mg/dL   Total Protein 6.6 6.0 - 8.5 g/dL   Albumin 4.3 3.6 - 4.8 g/dL   Globulin, Total 2.3 1.5 - 4.5 g/dL   Albumin/Globulin Ratio 1.9 1.2 - 2.2   Bilirubin Total 0.3 0.0 - 1.2 mg/dL   Alkaline Phosphatase 63 39 - 117 IU/L   AST 16 0 - 40 IU/L   ALT 18 0 - 44 IU/L  Lipid panel  Result Value Ref Range   Cholesterol, Total 189 100 - 199 mg/dL   Triglycerides 75 0 - 149 mg/dL   HDL 50 >39 mg/dL   VLDL Cholesterol Cal 15 5 - 40 mg/dL   LDL Calculated 124 (H) 0 - 99 mg/dL   Chol/HDL Ratio 3.8 0.0 - 5.0 ratio  Hemoglobin A1c  Result Value Ref Range   Hgb A1c MFr Bld 5.6 4.8 - 5.6 %   Est. average glucose Bld gHb Est-mCnc 114 mg/dL  CBC with Differential/Platelet  Result Value Ref Range   WBC 5.9 3.4 - 10.8 x10E3/uL   RBC 4.77 4.14 - 5.80 x10E6/uL   Hemoglobin 15.4 13.0 - 17.7 g/dL   Hematocrit 44.7 37.5 - 51.0 %   MCV 94 79 - 97 fL   MCH 32.3 26.6 - 33.0 pg   MCHC 34.5 31.5 - 35.7 g/dL   RDW 12.4 12.3 - 15.4 %   Platelets 209 150 - 379 x10E3/uL   Neutrophils 67 Not Estab. %   Lymphs 22 Not Estab. %   Monocytes 8 Not Estab. %   Eos 3 Not Estab. %   Basos 0 Not Estab. %   Neutrophils Absolute 3.9 1.4 -  7.0 x10E3/uL   Lymphocytes Absolute 1.3 0.7 - 3.1 x10E3/uL   Monocytes Absolute 0.5 0.1 - 0.9 x10E3/uL   EOS (ABSOLUTE) 0.2 0.0 - 0.4 x10E3/uL   Basophils Absolute 0.0 0.0 - 0.2 x10E3/uL    Immature Granulocytes 0 Not Estab. %   Immature Grans (Abs) 0.0 0.0 - 0.1 x10E3/uL  Hepatitis C antibody  Result Value Ref Range   Hep C Virus Ab <0.1 0.0 - 0.9 s/co ratio  HIV antibody  Result Value Ref Range   HIV Screen 4th Generation wRfx Non Reactive Non Reactive      Assessment & Plan:   Problem List Items Addressed This Visit    Elevated PSA    Low risk patient, with prior reported negative screening - PSA 1 to 2 in past, has seen Urology [facility], uncertain why due to still normal result. - Last PSA 2.1 (11/14/16), prior values normal - Last DRE 30g prostate w/o abnormality, 05/2016 per Urology - Clinically asymptomatic  Plan: 1. Reviewed prostate cancer screening guidelines and risks including potential prostate biopsy if abnormal PSA - may need to return to Urology if PSA actually does trend up - but do not see reason for this now since stable. - Proceed with yearly PSA for now, and anticipate DRE as needed especially if abnormal PSA or new symptoms  Does not need to return to [facility] for prostate monitoring      Relevant Orders   PSA   Essential hypertension    Well-controlled - now concern ACEi cough Home readings normal, infrequent No known complications    Plan:  1. DISCONTINUE Lisinopril 10mg  - possible ACEi cough, otherwise can be lingering URI vs bronchitis from prior infection, however given chronicity and patient preference, will agree to switch to ARB - START ARB - Valsartan 40mg  daily - low dose- sent to mail order [facility] 2. Encourage improve regular exercise, low sodium diet 3. Continue monitor BP at home or at drug store occasionally 4. Follow-up 6 months      Relevant Medications   valsartan (DIOVAN) 40 MG tablet   sildenafil (REVATIO) 20 MG tablet   Hyperlipidemia    Due for fasting lipids now Previously he had mostly controlled cholesterol on lifestyle Last lipid panel 03/2017 Calculated ASCVD 10 yr risk score 11%  Plan: 1. Reviewed ASCVD  risk and offered statin therapy - patient declines again at this time, prefers to only take ASA for now and improve lifestyle, understands counseling on benefits and risk reduction of statin - reconsider in future pending inc risk and lipid results 2. Continue ASA 81mg  for primary ASCVD risk reduction 3. Encourage improved lifestyle - low carb/cholesterol, reduce portion size, continue improving regular exercise 4. Follow-up yearly lipids      Relevant Medications   valsartan (DIOVAN) 40 MG tablet   sildenafil (REVATIO) 20 MG tablet   Other male erectile dysfunction    Consistent with likely age-related ED, also with some mild HTN and elevated LDL (HLD), PreDM Improved on PDE5  Plan: 1. Refill generic Sildenafil 20mg  tabs, take 3-5 tabs about 30 min prior to sexual activity, refills provided - printed 2. Follow-up as needed - was followed by urology but did not need further assistance, may return to [facility] in future if need      Relevant Medications   sildenafil (REVATIO) 20 MG tablet   Pre-diabetes    Due for repeat A1c now Previously well-controlled Pre-DM with A1c 5.6 Concern with obesity, HTN, HLD  Plan:  1. Not on any therapy currently  2. Encourage improved lifestyle - low carb, low sugar diet, reduce portion size, continue improving regular exercise 3. Follow-up 6 months A1c - pending current result, if abnormal may return sooner as 3 months        Other Visit Diagnoses    Annual physical exam    -  Primary    Updated health maintenance Reviewed rec for lab orders - placed printed and given to patient to take to [facility], will call to review Encourage now improve lifestyle diet and exercise goal wt loss    Meds ordered this encounter  Medications  . valsartan (DIOVAN) 40 MG tablet    Sig: Take 1 tablet (40 mg total) by mouth daily.    Dispense:  90 tablet    Refill:  3  . sildenafil (REVATIO) 20 MG tablet    Sig: Take 3-5 pills about 30 min prior to sex     Dispense:  20 tablet    Refill:  5    Follow up plan: Return in about 6 months (around 05/15/2018) for PreDM A1c, HTN.  Nobie Putnam, [facility] 11/12/2017, 5:55 PM

## 2017-11-12 NOTE — Assessment & Plan Note (Signed)
Well-controlled - now concern ACEi cough Home readings normal, infrequent No known complications    Plan:  1. DISCONTINUE Lisinopril 10mg  - possible ACEi cough, otherwise can be lingering URI vs bronchitis from prior infection, however given chronicity and patient preference, will agree to switch to ARB - START ARB - Valsartan 40mg  daily - low dose- sent to mail order [facility] 2. Encourage improve regular exercise, low sodium diet 3. Continue monitor BP at home or at drug store occasionally 4. Follow-up 6 months

## 2017-11-12 NOTE — Assessment & Plan Note (Signed)
Low risk patient, with prior reported negative screening - PSA 1 to 2 in past, has seen Urology [facility], uncertain why due to still normal result. - Last PSA 2.1 (11/14/16), prior values normal - Last DRE 30g prostate w/o abnormality, 05/2016 per Urology - Clinically asymptomatic  Plan: 1. Reviewed prostate cancer screening guidelines and risks including potential prostate biopsy if abnormal PSA - may need to return to Urology if PSA actually does trend up - but do not see reason for this now since stable. - Proceed with yearly PSA for now, and anticipate DRE as needed especially if abnormal PSA or new symptoms  Does not need to return to [facility] for prostate monitoring

## 2017-11-14 LAB — COMPREHENSIVE METABOLIC PANEL
ALBUMIN: 4.1 g/dL (ref 3.6–4.8)
ALT: 20 IU/L (ref 0–44)
AST: 23 IU/L (ref 0–40)
Albumin/Globulin Ratio: 1.5 (ref 1.2–2.2)
Alkaline Phosphatase: 66 IU/L (ref 39–117)
BILIRUBIN TOTAL: 0.5 mg/dL (ref 0.0–1.2)
BUN / CREAT RATIO: 12 (ref 10–24)
BUN: 12 mg/dL (ref 8–27)
CHLORIDE: 103 mmol/L (ref 96–106)
CO2: 21 mmol/L (ref 20–29)
CREATININE: 0.97 mg/dL (ref 0.76–1.27)
Calcium: 9.1 mg/dL (ref 8.6–10.2)
GFR, EST AFRICAN AMERICAN: 95 mL/min/{1.73_m2} (ref >59)
GFR, EST NON AFRICAN AMERICAN: 82 mL/min/{1.73_m2} (ref >59)
GLUCOSE: 112 mg/dL — AB (ref 65–99)
Globulin, Total: 2.8 g/dL (ref 1.5–4.5)
Potassium: 5.1 mmol/L (ref 3.5–5.2)
Sodium: 141 mmol/L (ref 134–144)
TOTAL PROTEIN: 6.9 g/dL (ref 6.0–8.5)

## 2017-11-14 LAB — CBC WITH DIFFERENTIAL/PLATELET
BASOS ABS: 0 10*3/uL (ref 0.0–0.2)
Basos: 0 %
EOS (ABSOLUTE): 0.2 10*3/uL (ref 0.0–0.4)
Eos: 3 %
Hematocrit: 45 % (ref 37.5–51.0)
Hemoglobin: 15.9 g/dL (ref 13.0–17.7)
IMMATURE GRANS (ABS): 0 10*3/uL (ref 0.0–0.1)
IMMATURE GRANULOCYTES: 0 %
LYMPHS: 30 %
Lymphocytes Absolute: 2.1 10*3/uL (ref 0.7–3.1)
MCH: 32.9 pg (ref 26.6–33.0)
MCHC: 35.3 g/dL (ref 31.5–35.7)
MCV: 93 fL (ref 79–97)
MONOS ABS: 0.9 10*3/uL (ref 0.1–0.9)
Monocytes: 13 %
NEUTROS PCT: 54 %
Neutrophils Absolute: 3.7 10*3/uL (ref 1.4–7.0)
PLATELETS: 241 10*3/uL (ref 150–379)
RBC: 4.84 x10E6/uL (ref 4.14–5.80)
RDW: 12.7 % (ref 12.3–15.4)
WBC: 6.9 10*3/uL (ref 3.4–10.8)

## 2017-11-14 LAB — LIPID PANEL
CHOLESTEROL TOTAL: 192 mg/dL (ref 100–199)
Chol/HDL Ratio: 3.5 ratio (ref 0.0–5.0)
HDL: 55 mg/dL (ref >39)
LDL CALC: 124 mg/dL — AB (ref 0–99)
Triglycerides: 64 mg/dL (ref 0–149)
VLDL Cholesterol Cal: 13 mg/dL (ref 5–40)

## 2017-11-14 LAB — PSA: PROSTATE SPECIFIC AG, SERUM: 1.9 ng/mL (ref 0.0–4.0)

## 2017-11-14 LAB — HEMOGLOBIN A1C
Est. average glucose Bld gHb Est-mCnc: 120 mg/dL
HEMOGLOBIN A1C: 5.8 % — AB (ref 4.8–5.6)

## 2018-05-18 ENCOUNTER — Ambulatory Visit: Payer: Managed Care, Other (non HMO) | Admitting: Family Medicine

## 2018-05-18 ENCOUNTER — Encounter: Payer: Self-pay | Admitting: Family Medicine

## 2018-05-18 ENCOUNTER — Other Ambulatory Visit: Payer: Self-pay | Admitting: Family Medicine

## 2018-05-18 VITALS — BP 138/80 | HR 66 | Temp 98.3°F | Resp 16 | Ht 72.0 in | Wt 228.0 lb

## 2018-05-18 DIAGNOSIS — E7800 Pure hypercholesterolemia, unspecified: Secondary | ICD-10-CM

## 2018-05-18 DIAGNOSIS — R7303 Prediabetes: Secondary | ICD-10-CM

## 2018-05-18 DIAGNOSIS — R972 Elevated prostate specific antigen [PSA]: Secondary | ICD-10-CM

## 2018-05-18 DIAGNOSIS — E669 Obesity, unspecified: Secondary | ICD-10-CM | POA: Diagnosis not present

## 2018-05-18 DIAGNOSIS — I10 Essential (primary) hypertension: Secondary | ICD-10-CM | POA: Diagnosis not present

## 2018-05-18 DIAGNOSIS — Z0000 Encounter for general adult medical examination without abnormal findings: Secondary | ICD-10-CM

## 2018-05-18 LAB — POCT GLYCOSYLATED HEMOGLOBIN (HGB A1C): Hemoglobin A1C: 5.6 % (ref 4.0–5.6)

## 2018-05-18 NOTE — Assessment & Plan Note (Addendum)
Mildly elevated initial BP, repeat manual check improved to near goal, still SBP >138. - Home BP readings similar with high 532Y  No known complications  Failed Lisinopril (ACEi cough)    Plan:  1. INCREASE Valsartan from 40mg  daily up to 80mg  daily - he can double up on current 40mg  tabs for now, has 2 bottles, trial at home and measure readings. If works and ready to change rx - notify us and we can send new rx Valsartan 80mg  to pharmacy 2. Encourage improved lifestyle - low sodium diet, regular exercise 3. Continue monitor BP outside office, bring readings to next visit, if persistently >140/90 or new symptoms notify office sooner 4. Follow-up 6 months for annual

## 2018-05-18 NOTE — Progress Notes (Signed)
Subjective:    Patient ID: CAVION FAIOLA, male    DOB: [birth year], 67 y.o.   MRN: [id number]  GARETT TETZLOFF is a 67 y.o. male presenting on 05/18/2018 for Hypertension   HPI  Pre-Diabetes/ OBESITY BMI >30 Last lab A1c 5.8 elevated about 6 months ago. No new concerns today Lifestyle: - Weight gain 1 lb in 6 months - Diet: stable diet, limited changes, limiting carbs and sweets, drinks mostly water, 1-2 diet drinks daily - Exercise: goal to inc bike and walking Denies hypoglycemia, polyuria, visual changes, numbness or tingling.  CHRONIC HTN: Last visit 10/2017, he was having ACEi cough on Lisinopril, he was switched from Lisinopril 10mg  over to Valsartan 40mg  daily - update he has tolerated this well, and the switch to ARB has RESOLVED his ACEI cough. Home BP normal readings, doesn't have list here today He has 2 bottles of med left, not ready for refill Current Meds -Valsartan 40mg  daily Reports good compliance, took meds today. Tolerating well, w/o complaints. Lifestyle: (see below) - Drinks Drinks mostly water, still occasional diet soda 1-2 daily Denies CP, dyspnea, HA, edema, dizziness / lightheadedness   Health Maintenance: Due for Flu Shot, declines today despite counseling on benefits  - he is not around many people due to work and he prefers to avoid vaccine.   Depression screen [facility] 2/9 05/18/2018 11/12/2017 06/11/2016  Decreased Interest 0 0 0  Down, Depressed, Hopeless 0 0 0  PHQ - 2 Score 0 0 0    Social History   Tobacco Use  . Smoking status: Never Smoker  . Smokeless tobacco: Never Used  Substance Use Topics  . Alcohol use: No  . Drug use: No    Review of Systems Per HPI unless specifically indicated above     Objective:    BP 138/80 (BP Location: Left Arm, Cuff Size: Normal)   Pulse 66   Temp 98.3 F (36.8 C) (Oral)   Resp 16   Ht 6' (1.829 m)   Wt 228 lb (103.4 kg)   BMI 30.92 kg/m   Wt Readings from Last 3 Encounters:  05/18/18 228  lb (103.4 kg)  11/12/17 226 lb 9.6 oz (102.8 kg)  05/05/17 220 lb (99.8 kg)    Physical Exam  Constitutional: He is oriented to person, place, and time. He appears well-developed and well-nourished. No distress.  Well-appearing, comfortable, cooperative  HENT:  Head: Normocephalic and atraumatic.  Mouth/Throat: Oropharynx is clear and moist.  Eyes: Conjunctivae are normal. Right eye exhibits no discharge. Left eye exhibits no discharge.  Cardiovascular: Normal rate, regular rhythm, normal heart sounds and intact distal pulses.  No murmur heard. Pulmonary/Chest: Effort normal.  Musculoskeletal: Normal range of motion. He exhibits no edema.  Neurological: He is alert and oriented to person, place, and time.  Skin: Skin is warm and dry. No rash noted. He is not diaphoretic. No erythema.  Psychiatric: He has a normal mood and affect. His behavior is normal.  Well groomed, good eye contact, normal speech and thoughts  Nursing note and vitals reviewed.    Recent Labs    11/13/17 0803 05/18/18 1626  HGBA1C 5.8* 5.6    Results for orders placed or performed in visit on 05/18/18  POCT HgB A1C  Result Value Ref Range   Hemoglobin A1C 5.6 4.0 - 5.6 %      Assessment & Plan:   Problem List Items Addressed This Visit    Essential hypertension    Mildly elevated  initial BP, repeat manual check improved to near goal, still SBP >138. - Home BP readings similar with high 863O  No known complications  Failed Lisinopril (ACEi cough)    Plan:  1. INCREASE Valsartan from 40mg  daily up to 80mg  daily - he can double up on current 40mg  tabs for now, has 2 bottles, trial at home and measure readings. If works and ready to change rx - notify us and we can send new rx Valsartan 80mg  to pharmacy 2. Encourage improved lifestyle - low sodium diet, regular exercise 3. Continue monitor BP outside office, bring readings to next visit, if persistently >140/90 or new symptoms notify office sooner 4.  Follow-up 6 months for annual      Relevant Medications   valsartan (DIOVAN) 40 MG tablet   Obesity (BMI 30.0-34.9)    Stable +1 lb in 6 months, BMI >30 Encourage continue improved lifestyle diet/exercise      Pre-diabetes - Primary    Improved A1c down to 5.6, below PreDM range With HLD and HTN, fairly well controlled overall Wt stable  Plan:  1. Not on any therapy currently  2. Encourage improved lifestyle - low carb, low sugar diet, reduce portion size, continue improving regular exercise 3. Follow-up 6 months for yearly labs      Relevant Orders   POCT HgB A1C (Completed)      No orders of the defined types were placed in this encounter.   Follow up plan: Return in about 6 months (around 11/16/2018) for Annual Physical.  Future labs ordered for 11/06/18  Nobie Putnam, [facility] 05/18/2018, 4:59 PM

## 2018-05-18 NOTE — Patient Instructions (Addendum)
Thank you for coming to the office today.  Please call your Medicare Insurance Plan to speak with benefits and ask what the cost and coverage is for a "Annual Physical" (performed by your doctor) - Due to changes with Medicare and the Medicare advantage plans, we need to confirm this BEFORE we perform a physical and send out the charge. - All Medicare plans should cover an "Annual Medicare Wellness" (performed by a nurse health advisor, not doctor) - this is FREE and can be done once a year  ----------------------------------  Mild elevated BP still.  Increase Valsartan 40mg  tablet to DOUBLE dose - take two tablets for total of 80mg  per dose, ONCE a day.  Keep track of BP at home - BP goal is < 135/85  Notify us when ready for new rx Valsartan 80mg  - so just take ONE of the new tablet daily when you get it next year, but we need to hear from you when to  Order this.  Recent Labs    11/13/17 0803 05/18/18 1626  HGBA1C 5.8* 5.6   DUE for FASTING BLOOD WORK (no food or drink after midnight before the lab appointment, only water or coffee without cream/sugar on the morning of)  SCHEDULE "Lab Only" visit in the morning at the clinic for lab draw in 6 MONTHS   - Make sure Lab Only appointment is at about 1 week before your next appointment, so that results will be available  For Lab Results, once available within 2-3 days of blood draw, you can can log in to MyChart online to view your results and a brief explanation. Also, we can discuss results at next follow-up visit.  Please schedule a Follow-up Appointment to: Return in about 6 months (around 11/16/2018) for Annual Physical.  If you have any other questions or concerns, please feel free to call the office or send a message through [place]. You may also schedule an earlier appointment if necessary.  Additionally, you may be receiving a survey about your experience at our office within a few days to 1 week by e-mail or mail. We value  your feedback.  Nobie Putnam, DO [place]

## 2018-05-18 NOTE — Assessment & Plan Note (Signed)
Stable +1 lb in 6 months, BMI >30 Encourage continue improved lifestyle diet/exercise

## 2018-05-18 NOTE — Assessment & Plan Note (Signed)
Improved A1c down to 5.6, below PreDM range With HLD and HTN, fairly well controlled overall Wt stable  Plan:  1. Not on any therapy currently  2. Encourage improved lifestyle - low carb, low sugar diet, reduce portion size, continue improving regular exercise 3. Follow-up 6 months for yearly labs

## 2018-07-29 DIAGNOSIS — L570 Actinic keratosis: Secondary | ICD-10-CM | POA: Diagnosis not present

## 2018-07-29 DIAGNOSIS — L578 Other skin changes due to chronic exposure to nonionizing radiation: Secondary | ICD-10-CM | POA: Diagnosis not present

## 2018-08-18 ENCOUNTER — Encounter: Payer: Self-pay | Admitting: Family Medicine

## 2018-08-18 ENCOUNTER — Ambulatory Visit (INDEPENDENT_AMBULATORY_CARE_PROVIDER_SITE_OTHER): Payer: Medicare HMO | Admitting: Family Medicine

## 2018-08-18 ENCOUNTER — Other Ambulatory Visit: Payer: Self-pay | Admitting: Family Medicine

## 2018-08-18 VITALS — BP 136/85 | HR 66 | Temp 98.2°F | Resp 16 | Ht 72.0 in | Wt 235.4 lb

## 2018-08-18 DIAGNOSIS — Z0000 Encounter for general adult medical examination without abnormal findings: Secondary | ICD-10-CM | POA: Diagnosis not present

## 2018-08-18 DIAGNOSIS — K219 Gastro-esophageal reflux disease without esophagitis: Secondary | ICD-10-CM

## 2018-08-18 MED ORDER — OMEPRAZOLE 20 MG PO CPDR: 20.0000 mg | DELAYED_RELEASE_CAPSULE | Freq: Every day | ORAL | 1 refills | Status: DC

## 2018-08-18 NOTE — Patient Instructions (Addendum)
Thank you for coming to the office today.  Did have one issue with hearing, higher frequency - let me know if bothersome and if interested in a referral to Audiologist or hearing specialist.  Future reconsider Pneumonia vaccine if interested.  Please re-schedule Friday 5/22 - as I will be out of office. Keep apt for the bloodwork on Fri May 8.   Please schedule a Follow-up Appointment to: Return in about 3 months (around 11/16/2018) for re-schedule 5/22 for annual physical - since i will be out of office 5/22.  If you have any other questions or concerns, please feel free to call the office or send a message through [place]. You may also schedule an earlier appointment if necessary.  Additionally, you may be receiving a survey about your experience at our office within a few days to 1 week by e-mail or mail. We value your feedback.  Nobie Putnam, DO [place]

## 2018-08-18 NOTE — Progress Notes (Signed)
Patient: Zachary Wall, Male    DOB: 09-19-1950, 68 y.o.   MRN: [id number]  Visit Date: 08/18/2018  Today's Provider: Nobie Putnam, DO   Chief Complaint  Patient presents with  . Medicare Wellness    Subjective:   Zachary Wall is a 68 y.o. male who presents today for "Welcome to Medicare Visit"  Welcome to [employer] Visit:   HPI   Today no new concerns.  Agrees to have screening EKG today - no prior EKG  Health Maintenance: - Due for Flu Vaccine and Pneumonia vaccine prevnar 57 age 75 - but he declines despite counseling.  Chart reviewed and updated. Psychosocial and medical history were reviewed. Active issues reviewed and addressed as documented below.  Review of Systems    Past Medical History:  Diagnosis Date  . Cancer [facility])    skin    Past Surgical History:  Procedure Laterality Date  . SKIN CANCER EXCISION      Family History  Problem Relation Age of Onset  . Hematuria Neg Hx   . Prostate cancer Neg Hx   . Tuberculosis Neg Hx   . Kidney cancer Neg Hx   . Bladder Cancer Neg Hx     Social History   Socioeconomic History  . Marital status: Married    Spouse name: Not on file  . Number of children: Not on file  . Years of education: Not on file  . Highest education level: Not on file  Occupational History  . Not on file  Social Needs  . Financial resource strain: Not on file  . Food insecurity:    Worry: Not on file    Inability: Not on file  . Transportation needs:    Medical: Not on file    Non-medical: Not on file  Tobacco Use  . Smoking status: Never Smoker  . Smokeless tobacco: Never Used  Substance and Sexual Activity  . Alcohol use: No  . Drug use: No  . Sexual activity: Yes  Lifestyle  . Physical activity:    Days per week: Not on file    Minutes per session: Not on file  . Stress: Not on file  Relationships  . Social connections:    Talks on phone: Not on file    Gets together: Not on file    Attends  religious service: Not on file    Active member of club or organization: Not on file    Attends meetings of clubs or organizations: Not on file    Relationship status: Not on file  . Intimate partner violence:    Fear of current or ex partner: Not on file    Emotionally abused: Not on file    Physically abused: Not on file    Forced sexual activity: Not on file  Other Topics Concern  . Not on file  Social History Narrative  . Not on file    Outpatient Encounter Medications as of 08/18/2018  Medication Sig Note  . aspirin EC 81 MG tablet Take 1 tablet (81 mg total) daily by mouth.   . sildenafil (REVATIO) 20 MG tablet Take 3-5 pills about 30 min prior to sex   . valsartan (DIOVAN) 40 MG tablet Take 2 tablets (80 mg total) by mouth daily.   . ranitidine (ZANTAC) 150 MG capsule Take 150 mg by mouth daily as needed.  12/13/2014: Received from: [employer]   No facility-administered encounter medications on file as of 08/18/2018.  Functional Status Survey: Is the patient deaf or have difficulty hearing?: Yes(some sounds) Does the patient have difficulty seeing, even when wearing glasses/contacts?: No(wears reading glasses) Does the patient have difficulty concentrating, remembering, or making decisions?: No Does the patient have difficulty walking or climbing stairs?: No Does the patient have difficulty dressing or bathing?: No Does the patient have difficulty doing errands alone such as visiting a doctor's office or shopping?: No  Functional Ability / Safety Screening 1.  Was the timed Get Up and Go test longer than 30 seconds?  no 2.  Does the patient need help with the phone, transportation, shopping,      preparing meals, housework, laundry, medications, or managing money?  no 3.  Does the patient's home have hazards- No 4.  Has the patient noticed any hearing difficulties?   yes  Fall Risk Assessment Fall Risk  08/18/2018 05/18/2018 11/12/2017 06/11/2016  04/30/2016  Falls in the past year? 0 0 No No No  Follow up - Falls evaluation completed - - -    Depression Screen See under rooming Depression screen [facility] 2/9 08/18/2018 05/18/2018 11/12/2017 06/11/2016 04/30/2016  Decreased Interest 0 0 0 0 0  Down, Depressed, Hopeless 0 0 0 0 0  PHQ - 2 Score 0 0 0 0 0    Advanced Directives Does patient have a HCPOA?    no If yes, name and contact information:  Does patient have a living will or MOST form?  no  Objective:   Vitals: BP 136/85   Pulse 66   Temp 98.2 F (36.8 C)   Resp 16   Ht 6' (1.829 m)   Wt 235 lb 6.4 oz (106.8 kg)   SpO2 98%   BMI 31.93 kg/m  Body mass index is 31.93 kg/m.  Filed Weights   08/18/18 1523  Weight: 235 lb 6.4 oz (106.8 kg)     Hearing Screening   125Hz  250Hz  500Hz  1000Hz  2000Hz  3000Hz  4000Hz  6000Hz  8000Hz   Right ear:   Pass Pass Fail  Fail    Left ear:   Pass Pass Fail  Fail    Comments: 40 dBHL   Visual Acuity Screening   Right eye Left eye Both eyes  Without correction: 20/30 20/30 20/20   With correction:       Physical Exam Vitals signs and nursing note reviewed.  Constitutional:      General: He is not in acute distress.    Appearance: He is well-developed. He is not diaphoretic.     Comments: Well-appearing, comfortable, cooperative  HENT:     Head: Normocephalic and atraumatic.  Eyes:     General:        Right eye: No discharge.        Left eye: No discharge.     Conjunctiva/sclera: Conjunctivae normal.  Cardiovascular:     Rate and Rhythm: Normal rate.  Pulmonary:     Effort: Pulmonary effort is normal.  Skin:    General: Skin is warm and dry.     Findings: No erythema or rash.  Neurological:     Mental Status: He is alert and oriented to person, place, and time.  Psychiatric:        Behavior: Behavior normal.     Comments: Well groomed, good eye contact, normal speech and thoughts     MMSE - Mini Mental State Exam 08/18/2018  Orientation to time 5  Orientation to  Place 5  Registration 3  Attention/ Calculation 5  Recall 3  Language- name 2 objects 2  Language- repeat 1  Language- follow 3 step command 3  Language- read & follow direction 1  Write a sentence 1  Copy design 1  Total score 30     Assessment & Plan:     Annual Wellness Visit  Reviewed patient's Family Medical History Reviewed and updated list of patient's medical providers Assessment of cognitive impairment was done Assessed patient's functional ability Established a written schedule for health screening [place] Completed and Reviewed  Exercise Activities and Dietary recommendations Goals    . Weight (lb) < 200 lb (90.7 kg)       Immunization History  Administered Date(s) Administered  . Tdap 12/13/2014    Health Maintenance  Topic Date Due  . PNA vac Low Risk Adult (1 of 2 - PCV13) 05/31/2018  . INFLUENZA VACCINE  09/29/2018 (Originally 01/29/2018)  . TETANUS/TDAP  12/12/2024  . COLONOSCOPY  03/20/2025  . Hepatitis C Screening  Completed  . HIV Screening  Completed    Discussed health benefits of physical activity, and encouraged him to engage in regular exercise appropriate for his age and condition.   No orders of the defined types were placed in this encounter.   Current Outpatient Medications:  .  aspirin EC 81 MG tablet, Take 1 tablet (81 mg total) daily by mouth., Disp: , Rfl:  .  sildenafil (REVATIO) 20 MG tablet, Take 3-5 pills about 30 min prior to sex, Disp: 20 tablet, Rfl: 5 .  valsartan (DIOVAN) 40 MG tablet, Take 2 tablets (80 mg total) by mouth daily., Disp: 90 tablet, Rfl: 3 .  ranitidine (ZANTAC) 150 MG capsule, Take 150 mg by mouth daily as needed. , Disp: , Rfl:  There are no discontinued medications.  Next Medicare Wellness Visit in 12+ months w/ nurse health advisor  Nobie Putnam, [facility] 08/18/2018, 4:10 PM

## 2018-08-31 ENCOUNTER — Other Ambulatory Visit: Payer: Self-pay | Admitting: Family Medicine

## 2018-08-31 DIAGNOSIS — I10 Essential (primary) hypertension: Secondary | ICD-10-CM

## 2018-08-31 MED ORDER — VALSARTAN 80 MG PO TABS: 80.0000 mg | ORAL_TABLET | Freq: Every day | ORAL | 1 refills | Status: DC

## 2018-09-18 ENCOUNTER — Telehealth: Payer: Self-pay | Admitting: Family Medicine

## 2018-09-18 ENCOUNTER — Other Ambulatory Visit: Payer: Self-pay

## 2018-09-18 DIAGNOSIS — I10 Essential (primary) hypertension: Secondary | ICD-10-CM

## 2018-09-18 MED ORDER — VALSARTAN 80 MG PO TABS: 80.0000 mg | ORAL_TABLET | Freq: Every day | ORAL | 1 refills | Status: DC

## 2018-09-18 NOTE — Telephone Encounter (Signed)
Pt needs refill on valsartan sent to [facility] [phone]

## 2018-11-06 ENCOUNTER — Other Ambulatory Visit: Payer: Managed Care, Other (non HMO)

## 2018-11-10 ENCOUNTER — Encounter: Payer: Medicare HMO | Admitting: Family Medicine

## 2018-11-20 ENCOUNTER — Encounter: Payer: Managed Care, Other (non HMO) | Admitting: Family Medicine

## 2018-12-29 ENCOUNTER — Other Ambulatory Visit: Payer: Medicare HMO

## 2018-12-29 DIAGNOSIS — E7800 Pure hypercholesterolemia, unspecified: Secondary | ICD-10-CM | POA: Diagnosis not present

## 2018-12-29 DIAGNOSIS — R7303 Prediabetes: Secondary | ICD-10-CM | POA: Diagnosis not present

## 2018-12-29 DIAGNOSIS — I10 Essential (primary) hypertension: Secondary | ICD-10-CM | POA: Diagnosis not present

## 2018-12-29 DIAGNOSIS — Z0000 Encounter for general adult medical examination without abnormal findings: Secondary | ICD-10-CM | POA: Diagnosis not present

## 2018-12-29 DIAGNOSIS — R972 Elevated prostate specific antigen [PSA]: Secondary | ICD-10-CM | POA: Diagnosis not present

## 2018-12-30 LAB — HEMOGLOBIN A1C
Hgb A1c MFr Bld: 5.8 % of total Hgb — ABNORMAL HIGH (ref <5.7)
Mean Plasma Glucose: 120 (calc)
eAG (mmol/L): 6.6 (calc)

## 2018-12-30 LAB — COMPLETE METABOLIC PANEL WITH GFR
AG Ratio: 1.6 (calc) (ref 1.0–2.5)
ALT: 25 U/L (ref 9–46)
AST: 17 U/L (ref 10–35)
Albumin: 4.1 g/dL (ref 3.6–5.1)
Alkaline phosphatase (APISO): 63 U/L (ref 35–144)
BUN: 15 mg/dL (ref 7–25)
CO2: 27 mmol/L (ref 20–32)
Calcium: 9.2 mg/dL (ref 8.6–10.3)
Chloride: 103 mmol/L (ref 98–110)
Creat: 1.02 mg/dL (ref 0.70–1.25)
GFR, Est African American: 89 mL/min/{1.73_m2} (ref >=60)
GFR, Est Non African American: 77 mL/min/{1.73_m2} (ref >=60)
Globulin: 2.6 g/dL (calc) (ref 1.9–3.7)
Glucose, Bld: 98 mg/dL (ref 65–99)
Potassium: 4.3 mmol/L (ref 3.5–5.3)
Sodium: 139 mmol/L (ref 135–146)
Total Bilirubin: 0.8 mg/dL (ref 0.2–1.2)
Total Protein: 6.7 g/dL (ref 6.1–8.1)

## 2018-12-30 LAB — CBC WITH DIFFERENTIAL/PLATELET
Absolute Monocytes: 542 cells/uL (ref 200–950)
Basophils Absolute: 40 cells/uL (ref 0–200)
Basophils Relative: 0.7 %
Eosinophils Absolute: 160 cells/uL (ref 15–500)
Eosinophils Relative: 2.8 %
HCT: 47 % (ref 38.5–50.0)
Hemoglobin: 16.5 g/dL (ref 13.2–17.1)
Lymphs Abs: 2103 cells/uL (ref 850–3900)
MCH: 32.5 pg (ref 27.0–33.0)
MCHC: 35.1 g/dL (ref 32.0–36.0)
MCV: 92.5 fL (ref 80.0–100.0)
MPV: 10.3 fL (ref 7.5–12.5)
Monocytes Relative: 9.5 %
Neutro Abs: 2856 cells/uL (ref 1500–7800)
Neutrophils Relative %: 50.1 %
Platelets: 200 10*3/uL (ref 140–400)
RBC: 5.08 10*6/uL (ref 4.20–5.80)
RDW: 12 % (ref 11.0–15.0)
Total Lymphocyte: 36.9 %
WBC: 5.7 10*3/uL (ref 3.8–10.8)

## 2018-12-30 LAB — LIPID PANEL
Cholesterol: 184 mg/dL (ref <200)
HDL: 44 mg/dL (ref >=40)
LDL Cholesterol (Calc): 120 mg/dL (calc) — ABNORMAL HIGH
Non-HDL Cholesterol (Calc): 140 mg/dL (calc) — ABNORMAL HIGH (ref <130)
Total CHOL/HDL Ratio: 4.2 (calc) (ref <5.0)
Triglycerides: 102 mg/dL (ref <150)

## 2018-12-30 LAB — PSA: PSA: 1.8 ng/mL (ref <=4.0)

## 2019-01-05 ENCOUNTER — Encounter: Payer: Self-pay | Admitting: Family Medicine

## 2019-01-05 ENCOUNTER — Other Ambulatory Visit: Payer: Self-pay

## 2019-01-05 ENCOUNTER — Other Ambulatory Visit: Payer: Self-pay | Admitting: Family Medicine

## 2019-01-05 ENCOUNTER — Ambulatory Visit (INDEPENDENT_AMBULATORY_CARE_PROVIDER_SITE_OTHER): Payer: Medicare HMO | Admitting: Family Medicine

## 2019-01-05 VITALS — BP 143/79 | HR 66 | Temp 98.2°F | Resp 16 | Ht 72.0 in | Wt 231.0 lb

## 2019-01-05 DIAGNOSIS — R972 Elevated prostate specific antigen [PSA]: Secondary | ICD-10-CM

## 2019-01-05 DIAGNOSIS — I10 Essential (primary) hypertension: Secondary | ICD-10-CM

## 2019-01-05 DIAGNOSIS — Z0000 Encounter for general adult medical examination without abnormal findings: Secondary | ICD-10-CM

## 2019-01-05 DIAGNOSIS — E7800 Pure hypercholesterolemia, unspecified: Secondary | ICD-10-CM | POA: Diagnosis not present

## 2019-01-05 DIAGNOSIS — R7303 Prediabetes: Secondary | ICD-10-CM | POA: Diagnosis not present

## 2019-01-05 DIAGNOSIS — E669 Obesity, unspecified: Secondary | ICD-10-CM | POA: Diagnosis not present

## 2019-01-05 DIAGNOSIS — N528 Other male erectile dysfunction: Secondary | ICD-10-CM | POA: Diagnosis not present

## 2019-01-05 MED ORDER — SILDENAFIL CITRATE 20 MG PO TABS: ORAL_TABLET | ORAL | 5 refills | Status: DC

## 2019-01-05 NOTE — Assessment & Plan Note (Signed)
Stable PreDM A1c 5.8 With HLD and HTN, fairly well controlled overall Wt stable  Plan:  1. Not on any therapy currently  2. Encourage improved lifestyle - low carb, low sugar diet, reduce portion size, continue improving regular exercise 

## 2019-01-05 NOTE — Assessment & Plan Note (Signed)
Controlled BP - Home BP readings similar with high 301S  No known complications  Failed Lisinopril (ACEi cough)    Plan:  1. Continue Valsartan 80mg  daily 2. Encourage improved lifestyle - low sodium diet, regular exercise 3. Continue monitor BP outside office, bring readings to next visit, if persistently >140/90 or new symptoms notify office sooner

## 2019-01-05 NOTE — Assessment & Plan Note (Signed)
Low risk patient, with prior reported negative screening - PSA 1 to 2 in past, has seen Urology [facility], uncertain why due to still normal result. - Last PSA down to 1.8 11/2018 - Last DRE 30g prostate w/o abnormality, 05/2016 per Urology - Clinically asymptomatic  Plan: 1. Reviewed prostate cancer screening guidelines and risks including potential prostate biopsy if abnormal PSA - may need to return to Urology if PSA actually does trend up - Proceed with yearly PSA for now, and anticipate DRE as needed especially if abnormal PSA or new symptoms  Does not need to return to [facility] for prostate monitoring

## 2019-01-05 NOTE — Assessment & Plan Note (Signed)
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 Refill generic Sildenafil 20mg  tabs, take 3-5 tabs about 30 min prior to sexual activity, refills provided [facility] 2. Follow-up as needed - was followed by urology but did not need further assistance, may return to [facility] in future if need

## 2019-01-05 NOTE — Assessment & Plan Note (Signed)
Encourage continue weight loss lifestyle 

## 2019-01-05 NOTE — Progress Notes (Signed)
Subjective:    Patient ID: Zachary Wall, male    DOB: 13-Apr-1951, 68 y.o.   MRN: [id number]  DENO SIDA is a 68 y.o. male presenting on 01/05/2019 for Annual Exam   HPI   Here for Annual Physical and Lab Review  Pre-Diabetes/ OBESITY BMI >31 Last lab A1c 5.8, previously 5.6 to 5.8 No new concerns today Lifestyle: - Weight loss 4 lbs in 6 months - Diet: stable diet, limited changes, limiting carbs and sweets, drinks mostly water, 1-2 diet drinks daily, not eating fast food or eating out in >4 months - Exercise:some walking, but less active due to coronavirus pandemic Denies hypoglycemia, polyuria, visual changes, numbness or tingling.  CHRONIC HTN: Home BP normal readings, doesn't have list here today He has 2 bottles of med left, not ready for refill Current Meds -Valsartan 40mg  daily Reports good compliance, took meds today. Tolerating well, w/o complaints. Lifestyle: (see below) - Drinks Drinks mostly water, still occasional diet soda 1-2 daily Denies CP, dyspnea, HA, edema, dizziness / lightheadedness  Erectile Dysfunction Stable chronic problem. He takes Sildenafil up to 3 pills 20mg  each PRN with good results, request refill today  HYPERLIPIDEMIA: - Reports no concerns. Last lipid panel6/2020,mostly unchanged stillcontrolledwith slightly elevated LDL - Currentlynot on Statin, tolerating well without side effects or myalgias - Taking ASA EC 81mg  daily since last visit, tolerating well - he does admit some mild easy bleeding, but doing fine - No known CVD or MI in family history   Health Maintenance:  Prostate CA Screening: Previously followed by [facility] due to elevated PSA in past. Recent results were range of 1 to 2. Last reading PSA 1.8 (12/29/18). Currently asymptomatic without LUTS. No known family history of prostate CA  UTD colonoscopy - last 2016, per Dr Gaylyn Cheers - scanned into chart  UTD Hep C and HIV routine screening, negative  03/2017   Depression screen [facility] 2/9 01/05/2019 08/18/2018 05/18/2018  Decreased Interest 0 0 0  Down, Depressed, Hopeless 0 0 0  PHQ - 2 Score 0 0 0    Past Medical History:  Diagnosis Date  . Cancer [facility])    skin   Past Surgical History:  Procedure Laterality Date  . SKIN CANCER EXCISION     Social History   Socioeconomic History  . Marital status: Married    Spouse name: Not on file  . Number of children: Not on file  . Years of education: Not on file  . Highest education level: Not on file  Occupational History  . Not on file  Social Needs  . Financial resource strain: Not on file  . Food insecurity    Worry: Not on file    Inability: Not on file  . Transportation needs    Medical: Not on file    Non-medical: Not on file  Tobacco Use  . Smoking status: Never Smoker  . Smokeless tobacco: Never Used  Substance and Sexual Activity  . Alcohol use: No  . Drug use: No  . Sexual activity: Yes  Lifestyle  . Physical activity    Days per week: Not on file    Minutes per session: Not on file  . Stress: Not on file  Relationships  . Social Herbalist on phone: Not on file    Gets together: Not on file    Attends religious service: Not on file    Active member of club or organization: Not on file  Attends meetings of clubs or organizations: Not on file    Relationship status: Not on file  . Intimate partner violence    Fear of current or ex partner: Not on file    Emotionally abused: Not on file    Physically abused: Not on file    Forced sexual activity: Not on file  Other Topics Concern  . Not on file  Social History Narrative  . Not on file   Family History  Problem Relation Age of Onset  . Hematuria Neg Hx   . Prostate cancer Neg Hx   . Tuberculosis Neg Hx   . Kidney cancer Neg Hx   . Bladder Cancer Neg Hx    Current Outpatient Medications on File Prior to Visit  Medication Sig  . aspirin EC 81 MG tablet Take 1 tablet (81 mg total) daily  by mouth.  Marland Kitchen omeprazole (PRILOSEC) 20 MG capsule Take 1 capsule (20 mg total) by mouth daily before breakfast.  . valsartan (DIOVAN) 80 MG tablet Take 1 tablet (80 mg total) by mouth daily.   No current facility-administered medications on file prior to visit.     Review of Systems  Constitutional: Negative for activity change, appetite change, chills, diaphoresis, fatigue and fever.  HENT: Negative for congestion and hearing loss.   Eyes: Negative for visual disturbance.  Respiratory: Negative for cough, chest tightness, shortness of breath and wheezing.   Cardiovascular: Negative for chest pain, palpitations and leg swelling.  Gastrointestinal: Negative for abdominal pain, constipation, diarrhea, nausea and vomiting.  Genitourinary: Negative for dysuria, frequency and hematuria.  Musculoskeletal: Negative for arthralgias and neck pain.  Skin: Negative for rash.  Neurological: Negative for dizziness, weakness, light-headedness, numbness and headaches.  Hematological: Negative for adenopathy.  Psychiatric/Behavioral: Negative for behavioral problems, dysphoric mood and sleep disturbance.   Per HPI unless specifically indicated above     Objective:    BP (!) 143/79   Pulse 66   Temp 98.2 F (36.8 C) (Oral)   Resp 16   Ht 6' (1.829 m)   Wt 231 lb (104.8 kg)   BMI 31.33 kg/m   Wt Readings from Last 3 Encounters:  01/05/19 231 lb (104.8 kg)  08/18/18 235 lb 6.4 oz (106.8 kg)  05/18/18 228 lb (103.4 kg)    Physical Exam Vitals signs and nursing note reviewed.  Constitutional:      General: He is not in acute distress.    Appearance: He is well-developed. He is not diaphoretic.     Comments: Well-appearing, comfortable, cooperative  HENT:     Head: Normocephalic and atraumatic.  Eyes:     General:        Right eye: No discharge.        Left eye: No discharge.     Conjunctiva/sclera: Conjunctivae normal.     Pupils: Pupils are equal, round, and reactive to light.  Neck:      Musculoskeletal: Normal range of motion and neck supple.     Thyroid: No thyromegaly.  Cardiovascular:     Rate and Rhythm: Normal rate and regular rhythm.     Heart sounds: Normal heart sounds. No murmur.  Pulmonary:     Effort: Pulmonary effort is normal. No respiratory distress.     Breath sounds: Normal breath sounds. No wheezing or rales.  Abdominal:     General: Bowel sounds are normal. There is no distension.     Palpations: Abdomen is soft. There is no mass.  Tenderness: There is no abdominal tenderness.  Musculoskeletal: Normal range of motion.        General: No tenderness.     Comments: Upper / Lower Extremities: - Normal muscle tone, strength bilateral upper extremities 5/5, lower extremities 5/5  Lymphadenopathy:     Cervical: No cervical adenopathy.  Skin:    General: Skin is warm and dry.     Findings: No erythema or rash.  Neurological:     Mental Status: He is alert and oriented to person, place, and time.     Comments: Distal sensation intact to light touch all extremities  Psychiatric:        Behavior: Behavior normal.     Comments: Well groomed, good eye contact, normal speech and thoughts       Results for orders placed or performed in visit on 11/06/18  PSA  Result Value Ref Range   PSA 1.8 < OR = 4.0 ng/mL  Lipid panel  Result Value Ref Range   Cholesterol 184 <200 mg/dL   HDL 44 > OR = 40 mg/dL   Triglycerides 102 <150 mg/dL   LDL Cholesterol (Calc) 120 (H) mg/dL (calc)   Total CHOL/HDL Ratio 4.2 <5.0 (calc)   Non-HDL Cholesterol (Calc) 140 (H) <130 mg/dL (calc)  COMPLETE METABOLIC PANEL WITH GFR  Result Value Ref Range   Glucose, Bld 98 65 - 99 mg/dL   BUN 15 7 - 25 mg/dL   Creat 1.02 0.70 - 1.25 mg/dL   GFR, Est Non African American 77 > OR = 60 mL/min/1.59m2   GFR, Est African American 89 > OR = 60 mL/min/1.14m2   BUN/Creatinine Ratio NOT APPLICABLE 6 - 22 (calc)   Sodium 139 135 - 146 mmol/L   Potassium 4.3 3.5 - 5.3 mmol/L    Chloride 103 98 - 110 mmol/L   CO2 27 20 - 32 mmol/L   Calcium 9.2 8.6 - 10.3 mg/dL   Total Protein 6.7 6.1 - 8.1 g/dL   Albumin 4.1 3.6 - 5.1 g/dL   Globulin 2.6 1.9 - 3.7 g/dL (calc)   AG Ratio 1.6 1.0 - 2.5 (calc)   Total Bilirubin 0.8 0.2 - 1.2 mg/dL   Alkaline phosphatase (APISO) 63 35 - 144 U/L   AST 17 10 - 35 U/L   ALT 25 9 - 46 U/L  CBC with Differential/Platelet  Result Value Ref Range   WBC 5.7 3.8 - 10.8 Thousand/uL   RBC 5.08 4.20 - 5.80 Million/uL   Hemoglobin 16.5 13.2 - 17.1 g/dL   HCT 47.0 38.5 - 50.0 %   MCV 92.5 80.0 - 100.0 fL   MCH 32.5 27.0 - 33.0 pg   MCHC 35.1 32.0 - 36.0 g/dL   RDW 12.0 11.0 - 15.0 %   Platelets 200 140 - 400 Thousand/uL   MPV 10.3 7.5 - 12.5 fL   Neutro Abs 2,856 1,500 - 7,800 cells/uL   Lymphs Abs 2,103 850 - 3,900 cells/uL   Absolute Monocytes 542 200 - 950 cells/uL   Eosinophils Absolute 160 15 - 500 cells/uL   Basophils Absolute 40 0 - 200 cells/uL   Neutrophils Relative % 50.1 %   Total Lymphocyte 36.9 %   Monocytes Relative 9.5 %   Eosinophils Relative 2.8 %   Basophils Relative 0.7 %  Hemoglobin A1c  Result Value Ref Range   Hgb A1c MFr Bld 5.8 (H) <5.7 % of total Hgb   Mean Plasma Glucose 120 (calc)   eAG (mmol/L) 6.6 (calc)  Assessment & Plan:   Problem List Items Addressed This Visit    Elevated PSA    Low risk patient, with prior reported negative screening - PSA 1 to 2 in past, has seen Urology [facility], uncertain why due to still normal result. - Last PSA down to 1.8 11/2018 - Last DRE 30g prostate w/o abnormality, 05/2016 per Urology - Clinically asymptomatic  Plan: 1. Reviewed prostate cancer screening guidelines and risks including potential prostate biopsy if abnormal PSA - may need to return to Urology if PSA actually does trend up - Proceed with yearly PSA for now, and anticipate DRE as needed especially if abnormal PSA or new symptoms  Does not need to return to [facility] for prostate monitoring       Essential hypertension    Controlled BP - Home BP readings similar with high 638V  No known complications  Failed Lisinopril (ACEi cough)    Plan:  1. Continue Valsartan 80mg  daily 2. Encourage improved lifestyle - low sodium diet, regular exercise 3. Continue monitor BP outside office, bring readings to next visit, if persistently >140/90 or new symptoms notify office sooner      Relevant Medications   sildenafil (REVATIO) 20 MG tablet   Hyperlipidemia    Mild elevated LDL but improved overall Last lipid panel 11/2018 Calculated ASCVD 10 yr risk score 11%  Plan: 1. Reviewed ASCVD risk and offered statin therapy - patient declines again at this time, prefers to only take ASA for now and improve lifestyle, understands counseling on benefits and risk reduction of statin - reconsider in future pending inc risk and lipid results 2. Continue ASA 81mg  for primary ASCVD risk reduction 3. Encourage improved lifestyle - low carb/cholesterol, reduce portion size, continue improving regular exercise 4. Follow-up yearly lipids      Relevant Medications   sildenafil (REVATIO) 20 MG tablet   Obesity (BMI 30.0-34.9)    Encourage continue weight loss lifestyle      Other male erectile dysfunction    Consistent with likely age-related ED, also with some mild HTN and elevated LDL (HLD), PreDM Improved on PDE5  Plan: 1. Refill generic Sildenafil 20mg  tabs, take 3-5 tabs about 30 min prior to sexual activity, refills provided - printed [facility] or [facility] 2. Follow-up as needed - was followed by urology but did not need further assistance, may return to [facility] in future if need      Relevant Medications   sildenafil (REVATIO) 20 MG tablet   Pre-diabetes    Stable PreDM A1c 5.8 With HLD and HTN, fairly well controlled overall Wt stable  Plan:  1. Not on any therapy currently  2. Encourage improved lifestyle - low carb, low sugar diet, reduce portion size, continue improving regular  exercise       Other Visit Diagnoses    Annual physical exam    -  Primary      Updated Health Maintenance information - consider Cologuard in 2021 Reviewed recent lab results with patient Encouraged improvement to lifestyle with diet and exercise - Goal of weight loss   Meds ordered this encounter  Medications  . sildenafil (REVATIO) 20 MG tablet    Sig: Take 3-5 pills about 30 min prior to sex    Dispense:  20 tablet    Refill:  5    Follow up plan: Return in about 1 year (around 01/05/2020) for Annual Physical.  Future labs ordered for 12/2019  Nobie Putnam, [facility] 01/05/2019, 2:17 PM

## 2019-01-05 NOTE — Assessment & Plan Note (Signed)
Mild elevated LDL but improved overall Last lipid panel 11/2018 Calculated ASCVD 10 yr risk score 11%  Plan: 1. Reviewed ASCVD risk and offered statin therapy - patient declines again at this time, prefers to only take ASA for now and improve lifestyle, understands counseling on benefits and risk reduction of statin - reconsider in future pending inc risk and lipid results 2. Continue ASA 81mg  for primary ASCVD risk reduction 3. Encourage improved lifestyle - low carb/cholesterol, reduce portion size, continue improving regular exercise 4. Follow-up yearly lipids

## 2019-01-05 NOTE — Patient Instructions (Addendum)
Thank you for coming to the office today.   1. Chemistry - Normal results, including electrolytes, kidney and liver function.  - Normal fasting blood sugar   2. Hemoglobin A1c (Diabetes screening) - 5.8, slightly elevated previous 5.6 to 5.8 in range of Pre-Diabetes (>5.7 to 6.4)   3. PSA Prostate Cancer Screening - 1.8, negative.   4. CBC Blood Counts - Normal, no anemia, no other significant abnormality   5. Cholesterol - Mild elevated LDL otherwise normal.   - Go to [facility] to fill the rx, if still cannot or high price - then use Goodrx - pick the pharmacy you want, or use printed [facility] coupon  Colon Cancer Screening: - For all adults age 57+ routine colon cancer screening is highly recommended.     - Recent guidelines from [place] recommend starting age of 20 - Early detection of colon cancer is important, because often there are no warning signs or symptoms, also if found early usually it can be cured. Late stage is hard to treat.  - If you are not interested in Colonoscopy screening (if done and normal you could be cleared for 5 to 10 years until next due), then Cologuard is an excellent alternative for screening test for Colon Cancer. It is highly sensitive for detecting DNA of colon cancer from even the earliest stages. Also, there is NO bowel prep required. - If Cologuard is NEGATIVE, then it is good for 3 years before next due - If Cologuard is POSITIVE, then it is strongly advised to get a Colonoscopy, which allows the GI doctor to locate the source of the cancer or polyp (even very early stage) and treat it by removing it. ------------------------- If you would like to proceed with Cologuard (stool DNA test) - FIRST, call your insurance company and tell them you want to check cost of Cologuard tell them CPT Code [phone] (it may be completely covered and you could get for no cost, OR max cost without any coverage is about $600). Also, keep in mind if you do NOT  open the kit, and decide not to do the test, you will NOT be charged, you should contact the company if you decide not to do the test. - If you want to proceed, you can notify us (phone message, [place], or at next visit) and we will order it for you. The test kit will be delivered to you house within about 1 week. Follow instructions to collect sample, you may call the company for any help or questions, 24/7 telephone support at [phone].   Please schedule a Follow-up Appointment to: Return in about 1 year (around 01/05/2020) for Annual Physical.  If you have any other questions or concerns, please feel free to call the office or send a message through [place]. You may also schedule an earlier appointment if necessary.  Additionally, you may be receiving a survey about your experience at our office within a few days to 1 week by e-mail or mail. We value your feedback.  Nobie Putnam, DO [place]

## 2019-01-29 ENCOUNTER — Other Ambulatory Visit: Payer: Self-pay | Admitting: Family Medicine

## 2019-01-29 DIAGNOSIS — K219 Gastro-esophageal reflux disease without esophagitis: Secondary | ICD-10-CM

## 2019-02-22 ENCOUNTER — Other Ambulatory Visit: Payer: Self-pay | Admitting: Nurse Practitioner

## 2019-02-22 DIAGNOSIS — K219 Gastro-esophageal reflux disease without esophagitis: Secondary | ICD-10-CM

## 2019-04-28 DIAGNOSIS — R69 Illness, unspecified: Secondary | ICD-10-CM | POA: Diagnosis not present

## 2019-05-05 DIAGNOSIS — R69 Illness, unspecified: Secondary | ICD-10-CM | POA: Diagnosis not present

## 2019-07-08 ENCOUNTER — Ambulatory Visit: Payer: Medicare HMO | Attending: Internal Medicine

## 2019-07-08 DIAGNOSIS — Z20822 Contact with and (suspected) exposure to covid-19: Secondary | ICD-10-CM

## 2019-07-10 LAB — NOVEL CORONAVIRUS, NAA: SARS-CoV-2, NAA: NOT DETECTED

## 2019-07-15 ENCOUNTER — Ambulatory Visit (INDEPENDENT_AMBULATORY_CARE_PROVIDER_SITE_OTHER): Payer: Medicare HMO | Admitting: Physician Assistant

## 2019-07-15 ENCOUNTER — Encounter: Payer: Self-pay | Admitting: Physician Assistant

## 2019-07-15 ENCOUNTER — Other Ambulatory Visit: Payer: Self-pay

## 2019-07-15 VITALS — Temp 98.6°F

## 2019-07-15 DIAGNOSIS — R05 Cough: Secondary | ICD-10-CM

## 2019-07-15 DIAGNOSIS — R059 Cough, unspecified: Secondary | ICD-10-CM

## 2019-07-15 DIAGNOSIS — Z20822 Contact with and (suspected) exposure to covid-19: Secondary | ICD-10-CM

## 2019-07-15 DIAGNOSIS — R21 Rash and other nonspecific skin eruption: Secondary | ICD-10-CM

## 2019-07-15 MED ORDER — PREDNISONE 10 MG PO TABS: ORAL_TABLET | ORAL | 0 refills | Status: DC

## 2019-07-15 NOTE — Patient Instructions (Signed)
COVID-19 COVID-19 is a respiratory infection that is caused by a virus called severe acute respiratory syndrome coronavirus 2 (SARS-CoV-2). The disease is also known as coronavirus disease or novel coronavirus. In some people, the virus may not cause any symptoms. In others, it may cause a serious infection. The infection can get worse quickly and can lead to complications, such as:  Pneumonia, or infection of the lungs.  Acute respiratory distress syndrome or ARDS. This is a condition in which fluid build-up in the lungs prevents the lungs from filling with air and passing oxygen into the blood.  Acute respiratory failure. This is a condition in which there is not enough oxygen passing from the lungs to the body or when carbon dioxide is not passing from the lungs out of the body.  Sepsis or septic shock. This is a serious bodily reaction to an infection.  Blood clotting problems.  Secondary infections due to bacteria or fungus.  Organ failure. This is when your body's organs stop working. The virus that causes COVID-19 is contagious. This means that it can spread from person to person through droplets from coughs and sneezes (respiratory secretions). What are the causes? This illness is caused by a virus. You may catch the virus by:  Breathing in droplets from an infected person. Droplets can be spread by a person breathing, speaking, singing, coughing, or sneezing.  Touching something, like a table or a doorknob, that was exposed to the virus (contaminated) and then touching your mouth, nose, or eyes. What increases the risk? Risk for infection You are more likely to be infected with this virus if you:  Are within 6 feet (2 meters) of a person with COVID-19.  Provide care for or live with a person who is infected with COVID-19.  Spend time in crowded indoor spaces or live in shared housing. Risk for serious illness You are more likely to become seriously ill from the virus if you:   Are 50 years of age or older. The higher your age, the more you are at risk for serious illness.  Live in a nursing home or long-term care facility.  Have cancer.  Have a long-term (chronic) disease such as: ? Chronic lung disease, including chronic obstructive pulmonary disease or asthma. ? A long-term disease that lowers your body's ability to fight infection (immunocompromised). ? Heart disease, including heart failure, a condition in which the arteries that lead to the heart become narrow or blocked (coronary artery disease), a disease which makes the heart muscle thick, weak, or stiff (cardiomyopathy). ? Diabetes. ? Chronic kidney disease. ? Sickle cell disease, a condition in which red blood cells have an abnormal "sickle" shape. ? Liver disease.  Are obese. What are the signs or symptoms? Symptoms of this condition can range from mild to severe. Symptoms may appear any time from 2 to 14 days after being exposed to the virus. They include:  A fever or chills.  A cough.  Difficulty breathing.  Headaches, body aches, or muscle aches.  Runny or stuffy (congested) nose.  A sore throat.  New loss of taste or smell. Some people may also have stomach problems, such as nausea, vomiting, or diarrhea. Other people may not have any symptoms of COVID-19. How is this diagnosed? This condition may be diagnosed based on:  Your signs and symptoms, especially if: ? You live in an area with a COVID-19 outbreak. ? You recently traveled to or from an area where the virus is common. ? You   provide care for or live with a person who was diagnosed with COVID-19. ? You were exposed to a person who was diagnosed with COVID-19.  A physical exam.  Lab tests, which may include: ? Taking a sample of fluid from the back of your nose and throat (nasopharyngeal fluid), your nose, or your throat using a swab. ? A sample of mucus from your lungs (sputum). ? Blood tests.  Imaging tests, which  may include, X-rays, CT scan, or ultrasound. How is this treated? At present, there is no medicine to treat COVID-19. Medicines that treat other diseases are being used on a trial basis to see if they are effective against COVID-19. Your health care provider will talk with you about ways to treat your symptoms. For most people, the infection is mild and can be managed at home with rest, fluids, and over-the-counter medicines. Treatment for a serious infection usually takes places in a hospital intensive care unit (ICU). It may include one or more of the following treatments. These treatments are given until your symptoms improve.  Receiving fluids and medicines through an IV.  Supplemental oxygen. Extra oxygen is given through a tube in the nose, a face mask, or a hood.  Positioning you to lie on your stomach (prone position). This makes it easier for oxygen to get into the lungs.  Continuous positive airway pressure (CPAP) or bi-level positive airway pressure (BPAP) machine. This treatment uses mild air pressure to keep the airways open. A tube that is connected to a motor delivers oxygen to the body.  Ventilator. This treatment moves air into and out of the lungs by using a tube that is placed in your windpipe.  Tracheostomy. This is a procedure to create a hole in the neck so that a breathing tube can be inserted.  Extracorporeal membrane oxygenation (ECMO). This procedure gives the lungs a chance to recover by taking over the functions of the heart and lungs. It supplies oxygen to the body and removes carbon dioxide. Follow these instructions at home: Lifestyle  If you are sick, stay home except to get medical care. Your health care provider will tell you how long to stay home. Call your health care provider before you go for medical care.  Rest at home as told by your health care provider.  Do not use any products that contain nicotine or tobacco, such as cigarettes, e-cigarettes, and  chewing tobacco. If you need help quitting, ask your health care provider.  Return to your normal activities as told by your health care provider. Ask your health care provider what activities are safe for you. General instructions  Take over-the-counter and prescription medicines only as told by your health care provider.  Drink enough fluid to keep your urine pale yellow.  Keep all follow-up visits as told by your health care provider. This is important. How is this prevented?  There is no vaccine to help prevent COVID-19 infection. However, there are steps you can take to protect yourself and others from this virus. To protect yourself:   Do not travel to areas where COVID-19 is a risk. The areas where COVID-19 is reported change often. To identify high-risk areas and travel restrictions, check the CDC travel website: wwwnc.cdc.gov/travel/notices  If you live in, or must travel to, an area where COVID-19 is a risk, take precautions to avoid infection. ? Stay away from people who are sick. ? Wash your hands often with soap and water for 20 seconds. If soap and water   are not available, use an alcohol-based hand sanitizer. ? Avoid touching your mouth, face, eyes, or nose. ? Avoid going out in public, follow guidance from your state and local health authorities. ? If you must go out in public, wear a cloth face covering or face mask. Make sure your mask covers your nose and mouth. ? Avoid crowded indoor spaces. Stay at least 6 feet (2 meters) away from others. ? Disinfect objects and surfaces that are frequently touched every day. This may include:  Counters and tables.  Doorknobs and light switches.  Sinks and faucets.  Electronics, such as phones, remote controls, keyboards, computers, and tablets. To protect others: If you have symptoms of COVID-19, take steps to prevent the virus from spreading to others.  If you think you have a COVID-19 infection, contact your health care  provider right away. Tell your health care team that you think you may have a COVID-19 infection.  Stay home. Leave your house only to seek medical care. Do not use public transport.  Do not travel while you are sick.  Wash your hands often with soap and water for 20 seconds. If soap and water are not available, use alcohol-based hand sanitizer.  Stay away from other members of your household. Let healthy household members care for children and pets, if possible. If you have to care for children or pets, wash your hands often and wear a mask. If possible, stay in your own room, separate from others. Use a different bathroom.  Make sure that all people in your household wash their hands well and often.  Cough or sneeze into a tissue or your sleeve or elbow. Do not cough or sneeze into your hand or into the air.  Wear a cloth face covering or face mask. Make sure your mask covers your nose and mouth. Where to find more information  Centers for Disease Control and Prevention: www.cdc.gov/coronavirus/2019-ncov/index.html  World Health Organization: www.who.int/health-topics/coronavirus Contact a health care provider if:  You live in or have traveled to an area where COVID-19 is a risk and you have symptoms of the infection.  You have had contact with someone who has COVID-19 and you have symptoms of the infection. Get help right away if:  You have trouble breathing.  You have pain or pressure in your chest.  You have confusion.  You have bluish lips and fingernails.  You have difficulty waking from sleep.  You have symptoms that get worse. These symptoms may represent a serious problem that is an emergency. Do not wait to see if the symptoms will go away. Get medical help right away. Call your local emergency services (911 in the [place] Do not drive yourself to the hospital. Let the emergency medical personnel know if you think you have COVID-19. Summary  COVID-19 is a  respiratory infection that is caused by a virus. It is also known as coronavirus disease or novel coronavirus. It can cause serious infections, such as pneumonia, acute respiratory distress syndrome, acute respiratory failure, or sepsis.  The virus that causes COVID-19 is contagious. This means that it can spread from person to person through droplets from breathing, speaking, singing, coughing, or sneezing.  You are more likely to develop a serious illness if you are 50 years of age or older, have a weak immune system, live in a nursing home, or have chronic disease.  There is no medicine to treat COVID-19. Your health care provider will talk with you about ways to treat your symptoms.    Take steps to protect yourself and others from infection. Wash your hands often and disinfect objects and surfaces that are frequently touched every day. Stay away from people who are sick and wear a mask if you are sick. This information is not intended to replace advice given to you by your health care provider. Make sure you discuss any questions you have with your health care provider. Document Revised: 04/16/2019 Document Reviewed: 07/23/2018 Elsevier Patient Education  2020 Elsevier Inc.  

## 2019-07-15 NOTE — Progress Notes (Addendum)
   Subjective:    Patient ID: Zachary Wall, male    DOB: 06/23/1951, 69 y.o.   MRN: [id number]  Zachary Wall is a 69 y.o. male presenting on 07/15/2019 for Cough ( Pt father was diagnose with COVID x 2 weeks ago and passed away. He complains of cough, low-grade fever and rash. He was tested last Thursday x 1 week ago. ) and Rash (back, chest, and  abdominal area x 1 week )  Virtual Visit via Telephone Note  I connected with Zachary Wall on 07/15/19 at  1:20 PM EST by telephone and verified that I am speaking with the correct person using two identifiers.   I discussed the limitations, risks, security and privacy concerns of performing an evaluation and management service by telephone and the availability of in person appointments. I also discussed with the patient that there may be a patient responsible charge related to this service. The patient expressed understanding and agreed to proceed.  HPI   Cough ( Pt father was diagnose with COVID x 2 weeks ago and passed away. He complains of cough, low-grade fever and rash. He was tested last Thursday x 1 week ago. ) and Rash (back, chest, and  abdominal area x 1 week ). The rash is itchy. Father who was a 41 years old fell on New Years Day and was diagnosed with COVID. Patient had helped his father off the ground after he fell. After being in contact with his father, he and his brother both got tested. His brother was positive. Patient himself was tested on 07/07/2018 and his test has been negative. Patient reports he is having some post nasal drainage. He is not having any SOB. He did have one episode of diarrhea on last Wednesday. He has tried tylenol and benadryl for symptoms.   Social History   Tobacco Use  . Smoking status: Never Smoker  . Smokeless tobacco: Never Used  Substance Use Topics  . Alcohol use: No  . Drug use: No    Review of Systems Per HPI unless specifically indicated above     Objective:    Temp 98.6 F (37 C)  (Oral)   Wt Readings from Last 3 Encounters:  01/05/19 231 lb (104.8 kg)  08/18/18 235 lb 6.4 oz (106.8 kg)  05/18/18 228 lb (103.4 kg)    Physical Exam Results for orders placed or performed in visit on 07/08/19  Novel Coronavirus, NAA ([facility])   Specimen: Nasopharyngeal(NP) swabs in vial transport medium   NASOPHARYNGE  TESTING  Result Value Ref Range   SARS-CoV-2, NAA Not Detected Not Detected      Assessment & Plan:  1. Close exposure to COVID-19 virus   2. Cough  - predniSONE (DELTASONE) 10 MG tablet; Take 6 pills on day 1, take 5 pills on day 2, and so on until complete.  Dispense: 21 tablet; Refill: 0  3. Rash  - predniSONE (DELTASONE) 10 MG tablet; Take 6 pills on day 1, take 5 pills on day 2, and so on until complete.  Dispense: 21 tablet; Refill: 0    Follow up plan: No follow-ups on file.   Carles Collet, PA-C [facility] 07/15/2019, 1:34 PM

## 2019-07-21 ENCOUNTER — Telehealth: Payer: Self-pay | Admitting: Family Medicine

## 2019-07-21 NOTE — Telephone Encounter (Signed)
I called the patient to reschedule 08/24/19 AWV to virtual visit, but there was no answer.

## 2019-08-24 ENCOUNTER — Ambulatory Visit: Payer: Medicare HMO

## 2019-08-31 ENCOUNTER — Ambulatory Visit (INDEPENDENT_AMBULATORY_CARE_PROVIDER_SITE_OTHER): Payer: Medicare HMO

## 2019-08-31 VITALS — Ht 72.0 in | Wt 225.0 lb

## 2019-08-31 DIAGNOSIS — Z0000 Encounter for general adult medical examination without abnormal findings: Secondary | ICD-10-CM

## 2019-08-31 NOTE — Progress Notes (Signed)
Subjective:   Zachary Wall is a 69 y.o. male who presents for an Initial Medicare Annual Wellness Visit.  This visit is being conducted via phone call  - after an attmept to do on video chat - due to the COVID-19 pandemic. This patient has given me verbal consent via phone to conduct this visit, patient states they are participating from their home address. Some vital signs may be absent or patient reported.   Patient identification: identified by name, DOB, and current address.    Review of Systems   Cardiac Risk Factors include: advanced age (>74men, >106 women);male gender;dyslipidemia;hypertension    Objective:    Today's Vitals   08/31/19 1413  Weight: 225 lb (102.1 kg)  Height: 6' (1.829 m)   Body mass index is 30.52 kg/m.  Advanced Directives 08/18/2018  Does Patient Have a Medical Advance Directive? No    Current Medications (verified) Outpatient Encounter Medications as of 08/31/2019  Medication Sig  . acetaminophen (TYLENOL) 500 MG chewable tablet Chew 500 mg by mouth every 6 (six) hours as needed for pain.  Marland Kitchen aspirin EC 81 MG tablet Take 1 tablet (81 mg total) daily by mouth.  Marland Kitchen omeprazole (PRILOSEC) 20 MG capsule TAKE 1 CAPSULE (20 MG TOTAL) BY MOUTH DAILY BEFORE BREAKFAST.  . sildenafil (REVATIO) 20 MG tablet Take 3-5 pills about 30 min prior to sex  . valsartan (DIOVAN) 80 MG tablet Take 1 tablet (80 mg total) by mouth daily.  . [DISCONTINUED] diphenhydrAMINE (BENADRYL ALLERGY) 25 mg capsule Take 25 mg by mouth every 4 (four) hours as needed.  . [DISCONTINUED] predniSONE (DELTASONE) 10 MG tablet Take 6 pills on day 1, take 5 pills on day 2, and so on until complete. (Patient not taking: Reported on 08/31/2019)   No facility-administered encounter medications on file as of 08/31/2019.    Allergies (verified) Ace inhibitors and Azithromycin   History: Past Medical History:  Diagnosis Date  . Cancer [facility])    skin   Past Surgical History:  Procedure  Laterality Date  . SKIN CANCER EXCISION     Family History  Problem Relation Age of Onset  . Hematuria Neg Hx   . Prostate cancer Neg Hx   . Tuberculosis Neg Hx   . Kidney cancer Neg Hx   . Bladder Cancer Neg Hx    Social History   Socioeconomic History  . Marital status: Married    Spouse name: Not on file  . Number of children: Not on file  . Years of education: Not on file  . Highest education level: Not on file  Occupational History  . Not on file  Tobacco Use  . Smoking status: Never Smoker  . Smokeless tobacco: Never Used  Substance and Sexual Activity  . Alcohol use: No  . Drug use: No  . Sexual activity: Yes  Other Topics Concern  . Not on file  Social History Narrative  . Not on file   Social Determinants of Health   Financial Resource Strain:   . Difficulty of Paying Living Expenses: Not on file  Food Insecurity:   . Worried About Charity fundraiser in the Last Year: Not on file  . Ran Out of Food in the Last Year: Not on file  Transportation Needs: No Transportation Needs  . Lack of Transportation (Medical): No  . Lack of Transportation (Non-Medical): No  Physical Activity:   . Days of Exercise per Week: Not on file  . Minutes of Exercise  per Session: Not on file  Stress:   . Feeling of Stress : Not on file  Social Connections: Somewhat Isolated  . Frequency of Communication with Friends and Family: More than three times a week  . Frequency of Social Gatherings with Friends and Family: More than three times a week  . Attends Religious Services: Never  . Active Member of Clubs or Organizations: No  . Attends Archivist Meetings: Never  . Marital Status: Married   Tobacco Counseling Counseling given: Not Answered   Clinical Intake:  Pre-visit preparation completed: Yes  Pain : No/denies pain     Nutritional Status: BMI > 30  Obese Nutritional Risks: None Diabetes: No  How often do you need to have someone help you when you  read instructions, pamphlets, or other written materials from your doctor or pharmacy?: 1 - Never  Interpreter Needed?: No  Information entered by :: Tiffany Hill,LPN  Activities of Daily Living In your present state of health, do you have any difficulty performing the following activities: 08/31/2019  Hearing? Y  Comment some sounds, no hearing aids  Vision? N  Comment reading glasses if reading a lot. no eye dr.  Difficulty concentrating or making decisions? N  Walking or climbing stairs? N  Dressing or bathing? N  Doing errands, shopping? N  Preparing Food and eating ? N  Using the Toilet? N  In the past six months, have you accidently leaked urine? N  Do you have problems with loss of bowel control? N  Managing your Medications? N  Managing your Finances? N  Housekeeping or managing your Housekeeping? N  Some recent data might be hidden     Immunizations and Health Maintenance Immunization History  Administered Date(s) Administered  . PFIZER SARS-COV-2 Vaccination 08/25/2019  . Tdap 12/13/2014   Health Maintenance Due  Topic Date Due  . PNA vac Low Risk Adult (1 of 2 - PCV13) 05/31/2018  . INFLUENZA VACCINE  01/30/2019    Patient Care Team: Olin Hauser, DO as PCP - General (Family Medicine)  Indicate any recent Medical Services you may have received from other than [facility] providers in the past year (date may be approximate).    Assessment:   This is a routine wellness examination for Zachary Wall.  Hearing/Vision screen No exam data present  Dietary issues and exercise activities discussed: Current Exercise Habits: Home exercise routine, Type of exercise: strength training/weights(stationary bike 2-3 miles a day), Time (Minutes): 40, Frequency (Times/Week): 7, Weekly Exercise (Minutes/Week): 280, Intensity: Moderate, Exercise limited by: None identified  Goals Addressed   None    Depression Screen PHQ 2/9 Scores 08/31/2019 01/05/2019 08/18/2018 05/18/2018   PHQ - 2 Score 0 0 0 0    Fall Risk Fall Risk  08/31/2019 01/05/2019 08/18/2018 05/18/2018 11/12/2017  Falls in the past year? 0 0 0 0 No  Number falls in past yr: 0 - - - -  Injury with Fall? 0 - - - -  Follow up - Falls evaluation completed - Falls evaluation completed -    FALL RISK PREVENTION PERTAINING TO THE HOME:  Any stairs in or around the home? No  If so, are there any without handrails? No   Home free of loose throw rugs in walkways, pet beds, electrical cords, etc? Yes  Adequate lighting in your home to reduce risk of falls? Yes   ASSISTIVE DEVICES UTILIZED TO PREVENT FALLS:  Life alert? No  Use of a cane, walker or w/c? No  Grab bars in the bathroom? No  Shower chair or bench in shower? No  Elevated toilet seat or a handicapped toilet? no   TIMED UP AND GO:  Unable to perform   Cognitive Function: MMSE - Mini Mental State Exam 08/18/2018  Orientation to time 5  Orientation to Place 5  Registration 3  Attention/ Calculation 5  Recall 3  Language- name 2 objects 2  Language- repeat 1  Language- follow 3 step command 3  Language- read & follow direction 1  Write a sentence 1  Copy design 1  Total score 30        Screening Tests Health Maintenance  Topic Date Due  . PNA vac Low Risk Adult (1 of 2 - PCV13) 05/31/2018  . INFLUENZA VACCINE  01/30/2019  . TETANUS/TDAP  12/12/2024  . COLONOSCOPY  03/20/2025  . Hepatitis C Screening  Completed    Qualifies for Shingles Vaccine? Not indicated   Tdap: up to date   Flu Vaccine: declined  Pneumococcal Vaccine: due now, declined    Covid-19 Vaccine: completed first dose.  Second dose scheduled for 09/15/2019  Cancer Screenings:  Colorectal Screening: Completed 2016. Repeat every 10 years      Additional Screening:  Hepatitis C Screening: does qualify; Completed 2018  Vision Screening: Recommended annual ophthalmology exams for early detection of glaucoma and other disorders of the eye. Is the  patient up to date with their annual eye exam?  No   Dental Screening: Recommended annual dental exams for proper oral hygiene  Community Resource Referral:  CRR required this visit?  No        Plan:  I have personally reviewed and addressed the Medicare Annual Wellness questionnaire and have noted the following in the patient's chart:  A. Medical and social history B. Use of alcohol, tobacco or illicit drugs  C. Current medications and supplements D. Functional ability and status E.  Nutritional status F.  Physical activity G. Advance directives H. List of other physicians I.  Hospitalizations, surgeries, and ER visits in previous 12 months J.  Guadalupe Guerra such as hearing and vision if needed, cognitive and depression L. Referrals and appointments   In addition, I have reviewed and discussed with patient certain preventive protocols, quality metrics, and best practice recommendations. A written personalized care plan for preventive services as well as general preventive health recommendations were provided to patient.   Signed,    Bevelyn Ngo, LPN   [id number]  Nurse Health Advisor   Nurse Notes:  None

## 2019-08-31 NOTE — Patient Instructions (Signed)
[name] , Thank you for taking time to come for your Medicare Wellness Visit. I appreciate your ongoing commitment to your health goals. Please review the following plan we discussed and let me know if I can assist you in the future.   Screening recommendations/referrals: Colonoscopy: completed 2016 Recommended yearly ophthalmology/optometry visit for glaucoma screening and checkup Recommended yearly dental visit for hygiene and checkup  Vaccinations: Influenza vaccine: declined  Pneumococcal vaccine:declined Tdap vaccine: up to date  Shingles vaccine: not indicated    Covid-19: completed first dose   Advanced directives: Please bring a copy of your health care power of attorney and living will to the office at your convenience if you have one, if you need the paperwork to complete this please pick it up from the office.   Conditions/risks identified: none   Next appointment: Follow up in one year for your annual wellness visit   Preventive Care 65 Years and Older, Male Preventive care refers to lifestyle choices and visits with your health care provider that can promote health and wellness. What does preventive care include?  A yearly physical exam. This is also called an annual well check.  Dental exams once or twice a year.  Routine eye exams. Ask your health care provider how often you should have your eyes checked.  Personal lifestyle choices, including:  Daily care of your teeth and gums.  Regular physical activity.  Eating a healthy diet.  Avoiding tobacco and drug use.  Limiting alcohol use.  Practicing safe sex.  Taking low doses of aspirin every day.  Taking vitamin and mineral supplements as recommended by your health care provider. What happens during an annual well check? The services and screenings done by your health care provider during your annual well check will depend on your age, overall health, lifestyle risk factors, and family history of  disease. Counseling  Your health care provider may ask you questions about your:  Alcohol use.  Tobacco use.  Drug use.  Emotional well-being.  Home and relationship well-being.  Sexual activity.  Eating habits.  History of falls.  Memory and ability to understand (cognition).  Work and work Statistician. Screening  You may have the following tests or measurements:  Height, weight, and BMI.  Blood pressure.  Lipid and cholesterol levels. These may be checked every 5 years, or more frequently if you are over 63 years old.  Skin check.  Lung cancer screening. You may have this screening every year starting at age 24 if you have a 30-pack-year history of smoking and currently smoke or have quit within the past 15 years.  Fecal occult blood test (FOBT) of the stool. You may have this test every year starting at age 37.  Flexible sigmoidoscopy or colonoscopy. You may have a sigmoidoscopy every 5 years or a colonoscopy every 10 years starting at age 79.  Prostate cancer screening. Recommendations will vary depending on your family history and other risks.  Hepatitis C blood test.  Hepatitis B blood test.  Sexually transmitted disease (STD) testing.  Diabetes screening. This is done by checking your blood sugar (glucose) after you have not eaten for a while (fasting). You may have this done every 1-3 years.  Abdominal aortic aneurysm (AAA) screening. You may need this if you are a current or former smoker.  Osteoporosis. You may be screened starting at age 53 if you are at high risk. Talk with your health care provider about your test results, treatment options, and if necessary,  the need for more tests. Vaccines  Your health care provider may recommend certain vaccines, such as:  Influenza vaccine. This is recommended every year.  Tetanus, diphtheria, and acellular pertussis (Tdap, Td) vaccine. You may need a Td booster every 10 years.  Zoster vaccine. You may  need this after age 90.  Pneumococcal 13-valent conjugate (PCV13) vaccine. One dose is recommended after age 66.  Pneumococcal polysaccharide (PPSV23) vaccine. One dose is recommended after age 12. Talk to your health care provider about which screenings and vaccines you need and how often you need them. This information is not intended to replace advice given to you by your health care provider. Make sure you discuss any questions you have with your health care provider. Document Released: 07/14/2015 Document Revised: 03/06/2016 Document Reviewed: 04/18/2015 Elsevier Interactive Patient Education  2017 [place] Prevention in the Home Falls can cause injuries. They can happen to people of all ages. There are many things you can do to make your home safe and to help prevent falls. What can I do on the outside of my home?  Regularly fix the edges of walkways and driveways and fix any cracks.  Remove anything that might make you trip as you walk through a door, such as a raised step or threshold.  Trim any bushes or trees on the path to your home.  Use bright outdoor lighting.  Clear any walking paths of anything that might make someone trip, such as rocks or tools.  Regularly check to see if handrails are loose or broken. Make sure that both sides of any steps have handrails.  Any raised decks and porches should have guardrails on the edges.  Have any leaves, snow, or ice cleared regularly.  Use sand or salt on walking paths during winter.  Clean up any spills in your garage right away. This includes oil or grease spills. What can I do in the bathroom?  Use night lights.  Install grab bars by the toilet and in the tub and shower. Do not use towel bars as grab bars.  Use non-skid mats or decals in the tub or shower.  If you need to sit down in the shower, use a plastic, non-slip stool.  Keep the floor dry. Clean up any water that spills on the floor as soon as it  happens.  Remove soap buildup in the tub or shower regularly.  Attach bath mats securely with double-sided non-slip rug tape.  Do not have throw rugs and other things on the floor that can make you trip. What can I do in the bedroom?  Use night lights.  Make sure that you have a light by your bed that is easy to reach.  Do not use any sheets or blankets that are too big for your bed. They should not hang down onto the floor.  Have a firm chair that has side arms. You can use this for support while you get dressed.  Do not have throw rugs and other things on the floor that can make you trip. What can I do in the kitchen?  Clean up any spills right away.  Avoid walking on wet floors.  Keep items that you use a lot in easy-to-reach places.  If you need to reach something above you, use a strong step stool that has a grab bar.  Keep electrical cords out of the way.  Do not use floor polish or wax that makes floors slippery. If you must use  wax, use non-skid floor wax.  Do not have throw rugs and other things on the floor that can make you trip. What can I do with my stairs?  Do not leave any items on the stairs.  Make sure that there are handrails on both sides of the stairs and use them. Fix handrails that are broken or loose. Make sure that handrails are as long as the stairways.  Check any carpeting to make sure that it is firmly attached to the stairs. Fix any carpet that is loose or worn.  Avoid having throw rugs at the top or bottom of the stairs. If you do have throw rugs, attach them to the floor with carpet tape.  Make sure that you have a light switch at the top of the stairs and the bottom of the stairs. If you do not have them, ask someone to add them for you. What else can I do to help prevent falls?  Wear shoes that:  Do not have high heels.  Have rubber bottoms.  Are comfortable and fit you well.  Are closed at the toe. Do not wear sandals.  If you  use a stepladder:  Make sure that it is fully opened. Do not climb a closed stepladder.  Make sure that both sides of the stepladder are locked into place.  Ask someone to hold it for you, if possible.  Clearly mark and make sure that you can see:  Any grab bars or handrails.  First and last steps.  Where the edge of each step is.  Use tools that help you move around (mobility aids) if they are needed. These include:  Canes.  Walkers.  Scooters.  Crutches.  Turn on the lights when you go into a dark area. Replace any light bulbs as soon as they burn out.  Set up your furniture so you have a clear path. Avoid moving your furniture around.  If any of your floors are uneven, fix them.  If there are any pets around you, be aware of where they are.  Review your medicines with your doctor. Some medicines can make you feel dizzy. This can increase your chance of falling. Ask your doctor what other things that you can do to help prevent falls. This information is not intended to replace advice given to you by your health care provider. Make sure you discuss any questions you have with your health care provider. Document Released: 04/13/2009 Document Revised: 11/23/2015 Document Reviewed: 07/22/2014 Elsevier Interactive Patient Education  2017 [employer].

## 2019-09-01 ENCOUNTER — Other Ambulatory Visit: Payer: Self-pay | Admitting: Family Medicine

## 2019-09-01 DIAGNOSIS — K219 Gastro-esophageal reflux disease without esophagitis: Secondary | ICD-10-CM

## 2019-09-14 ENCOUNTER — Other Ambulatory Visit: Payer: Self-pay | Admitting: Family Medicine

## 2019-09-14 DIAGNOSIS — I10 Essential (primary) hypertension: Secondary | ICD-10-CM

## 2019-11-01 ENCOUNTER — Other Ambulatory Visit: Payer: Self-pay

## 2019-11-01 ENCOUNTER — Ambulatory Visit (INDEPENDENT_AMBULATORY_CARE_PROVIDER_SITE_OTHER): Payer: Medicare HMO | Admitting: Family Medicine

## 2019-11-01 ENCOUNTER — Encounter: Payer: Self-pay | Admitting: Family Medicine

## 2019-11-01 VITALS — BP 136/87 | HR 65 | Temp 98.0°F | Resp 16 | Ht 72.0 in | Wt 238.0 lb

## 2019-11-01 DIAGNOSIS — M5441 Lumbago with sciatica, right side: Secondary | ICD-10-CM | POA: Diagnosis not present

## 2019-11-01 MED ORDER — PREDNISONE 10 MG PO TABS: ORAL_TABLET | ORAL | 0 refills | Status: DC

## 2019-11-01 NOTE — Patient Instructions (Addendum)
Thank you for coming to the office today.  For your Back Pain - I think that this is due to Muscle Spasms or strain. Your Sciatic Nerve can be affected causing some of your radiation and numbness down your legs.  - Start Prednisone taper (steroid anti-inflammatory) for nerve irritation with pain in legs. Each pill is 10mg . Take 6 pills (60mg  daily) for 1 day at same time with breakfast, then each day reduce dose by 1 pill, so 5 pills, then 4, then 3, then 2 then 1 (last 6 days). Do not take any Ibuprofen or Aleve while taking the Prednisone.  May use Tylenol Extra Str 500mg  tabs - may take 1-2 tablets every 6 hours as needed  Recommend to start using heating pad on your lower back 1-2x daily for few weeks  Also try a Wedge Seat Cushion to avoid nerve pinching when sitting prolonged period of time.  -----------------------   IF you improve or not all the way, and need an extra medicine AS NEEDED - call or message and we can send in this one:  Start Gabapentin 100mg  capsules, take at night for 2-3 nights only, and then increase to 2 times a day for a few days, and then may increase to 3 times a day, it may make you drowsy, if helps significantly at night only, then you can increase instead to 3 capsules at night, instead of 3 times a day - In the future if needed, we can significantly increase the dose if tolerated well, some common doses are 300mg  three times a day up to 600mg  three times a day, usually it takes several weeks or months to get to higher doses  This pain may take weeks to months to fully resolve, but hopefully it will respond to the medicine initially. All back injuries (small or serious) are slow to heal since we use our back muscles every day. Be careful with turning, twisting, lifting, sitting / standing for prolonged periods, and avoid re-injury.  If your symptoms significantly worsen with more pain, or new symptoms with weakness in one or both legs, new or different shooting  leg pains, numbness in legs or groin, loss of control or retention of urine or bowel movements, please call back for advice and you may need to go directly to the Emergency Department.  Please schedule a Follow-up Appointment to: Return in about 4 weeks (around 11/29/2019) for low back and leg pain if not improved.  If you have any other questions or concerns, please feel free to call the office or send a message through [place]. You may also schedule an earlier appointment if necessary.  Additionally, you may be receiving a survey about your experience at our office within a few days to 1 week by e-mail or mail. We value your feedback.  Nobie Putnam, DO [place]

## 2019-11-01 NOTE — Progress Notes (Signed)
Subjective:    Patient ID: Zachary Wall, male    DOB: 1951-02-02, 69 y.o.   MRN: [id number]  Zachary Wall is a 69 y.o. male presenting on 11/01/2019 for Back Pain (onset coupld of weeks radiate lower leg Left leg)  Patient presents for a same day appointment.   HPI  LOW BACK PAIN, acute, Left sciatica - Reports symptoms started about 2-3 weeks ago with new flare up - without known inciting injury. He has chronic issue with low back pain episodic in past, he has flare of back pain in past and has "thrown back out" many times in past.   Today seems to be persistent without resolving, but has episodic pains with some days worse than others. Describes pain as sharp shocking pain radiating down the left leg, down back of left leg from gluteal region below knee and into the calf, severity on average about 4 out of 10 and worst episode is 9 out of 10 with intermittent worsening. Admits pain radiating to Left leg. Occasionally prolonged seated or prolonged standing or activity worsens it. Occasionally walking can help it at times.  - Taking Tylenol Ext Str 500 mg x 1 tabs TWICE a day PRN. Not taking NSAIDs oral - Not tried heating pad at night, ice, muscle - History of lumbar OA/DJD - Prior similar back pain flares improved with rest and inversion table. He tried this now but did not help. He also tries back brace as needed -  No difficulty sleeping due to pain. Not bothering him at night.  - Denies any fevers/chills, numbness, tingling, weakness, loss of control bladder/bowel incontinence or retention, unintentional wt loss, night sweats  Depression screen [facility] 2/9 08/31/2019 01/05/2019 08/18/2018  Decreased Interest 0 0 0  Down, Depressed, Hopeless 0 0 0  PHQ - 2 Score 0 0 0    Social History   Tobacco Use  . Smoking status: Never Smoker  . Smokeless tobacco: Never Used  Substance Use Topics  . Alcohol use: No  . Drug use: No    Review of Systems Per HPI unless specifically indicated  above     Objective:    BP 136/87   Pulse 65   Temp 98 F (36.7 C) (Temporal)   Resp 16   Ht 6' (1.829 m)   Wt 238 lb (108 kg)   SpO2 100%   BMI 32.28 kg/m   Wt Readings from Last 3 Encounters:  11/01/19 238 lb (108 kg)  08/31/19 225 lb (102.1 kg)  01/05/19 231 lb (104.8 kg)    Physical Exam Vitals and nursing note reviewed.  Constitutional:      General: He is not in acute distress.    Appearance: He is well-developed. He is not diaphoretic.     Comments: Well-appearing, comfortable, cooperative  HENT:     Head: Normocephalic and atraumatic.  Eyes:     General:        Right eye: No discharge.        Left eye: No discharge.     Conjunctiva/sclera: Conjunctivae normal.  Cardiovascular:     Rate and Rhythm: Normal rate.  Pulmonary:     Effort: Pulmonary effort is normal.  Musculoskeletal:     Comments: Low Back Inspection: Normal appearance, no spinal deformity, symmetrical. Palpation: No tenderness over spinous processes. Bilateral lumbar paraspinal muscles non-tender and without hypertonicity/spasm. ROM: Full active ROM forward flex / back extension, rotation L/R without discomfort Special Testing: Seated SLR negative for radicular  pain bilaterally  Strength: Bilateral hip flex/ext 5/5, knee flex/ext 5/5, ankle dorsiflex/plantarflex 5/5 Neurovascular: intact distal sensation to light touch   Skin:    General: Skin is warm and dry.     Findings: No erythema or rash.  Neurological:     Mental Status: He is alert and oriented to person, place, and time.  Psychiatric:        Behavior: Behavior normal.     Comments: Well groomed, good eye contact, normal speech and thoughts        Assessment & Plan:   Problem List Items Addressed This Visit    None    Visit Diagnoses    Acute left-sided low back pain with right-sided sciatica    -  Primary   Relevant Medications   predniSONE (DELTASONE) 10 MG tablet      Subacute on chronic LBP, now with associated L  sciatica. Suspect likely due to muscle spasm/strain, without known injury or trauma. In setting of known chronic LBP with DJD - No red flag symptoms. Negative SLR for radiculopathy - Inadequate conservative therapy   Plan: 1. Start Prednisone burst taper 60 to 10mg  over 6 days, tolerated before 2. Increase Tylenol Ext Str to 1000mg  TID PRN 3. Future offer Gabapentin 100mg  PRN if persistent sciatica, defer muscle relaxant 4. Encouraged use of heating pad 1-2x daily for now then PRN 5.  Follow-up 4-6 weeks if not improved for re-evaluation, consider X-ray imaging, trial of PT, and possibly referral to Orthopedic    Meds ordered this encounter  Medications  . predniSONE (DELTASONE) 10 MG tablet    Sig: Take 6 tabs with breakfast Day 1, 5 tabs Day 2, 4 tabs Day 3, 3 tabs Day 4, 2 tabs Day 5, 1 tab Day 6.    Dispense:  21 tablet    Refill:  0      Follow up plan: Return in about 4 weeks (around 11/29/2019) for low back and leg pain if not improved.    Nobie Putnam, [facility] 11/01/2019, 4:39 PM

## 2019-11-15 ENCOUNTER — Telehealth: Payer: Self-pay

## 2019-11-15 DIAGNOSIS — M5441 Lumbago with sciatica, right side: Secondary | ICD-10-CM

## 2019-11-15 MED ORDER — GABAPENTIN 100 MG PO CAPS: ORAL_CAPSULE | ORAL | 2 refills | Status: DC

## 2019-11-15 NOTE — Telephone Encounter (Signed)
Copied from [place] [id number]. Topic: General - Other >> Nov 15, 2019  9:06 AM Leward Quan A wrote: Reason for CRM: Patient called to inform Dr that he has not had any relief from the pain he was seen for so he is asking if Dr Raliegh Ip can please contact him and let him know what the next step will be from here on out. [phone]

## 2019-11-15 NOTE — Telephone Encounter (Signed)
Last visit 11/01/19  He was given prednisone steroid. He said pain significantly improved then it started to wear off.  He was asked to increase Tylenol up to 1000mg  three times a day.  Also he was advised that if not improving we could add a Gabapentin for back pain if needed.  I called him today, discussed this. And he agrees to trial Gabapentin  Start Gabapentin 100mg  capsules, take at night for 2-3 nights only, and then increase to 2 times a day for a few days, and then may increase to 3 times a day, it may make you drowsy, if helps significantly at night only, then you can increase instead to 3 capsules at night, instead of 3 times a day - In the future if needed, we can significantly increase the dose if tolerated well, some common doses are 300mg  three times a day up to 600mg  three times a day, usually it takes several weeks or months to get to higher doses  Defer X-ray and referral  He can follow up within 3-6 weeks if not improved and discuss options refer or x-ray  Zachary Wall, [facility] 11/15/2019, 4:58 PM

## 2019-11-22 DIAGNOSIS — R69 Illness, unspecified: Secondary | ICD-10-CM | POA: Diagnosis not present

## 2019-11-25 DIAGNOSIS — I10 Essential (primary) hypertension: Secondary | ICD-10-CM | POA: Diagnosis not present

## 2019-11-25 DIAGNOSIS — E669 Obesity, unspecified: Secondary | ICD-10-CM | POA: Diagnosis not present

## 2019-11-25 DIAGNOSIS — K219 Gastro-esophageal reflux disease without esophagitis: Secondary | ICD-10-CM | POA: Diagnosis not present

## 2019-11-25 DIAGNOSIS — Z881 Allergy status to other antibiotic agents status: Secondary | ICD-10-CM | POA: Diagnosis not present

## 2019-11-25 DIAGNOSIS — Z8249 Family history of ischemic heart disease and other diseases of the circulatory system: Secondary | ICD-10-CM | POA: Diagnosis not present

## 2019-11-30 ENCOUNTER — Telehealth: Payer: Self-pay | Admitting: Family Medicine

## 2019-11-30 DIAGNOSIS — M5441 Lumbago with sciatica, right side: Secondary | ICD-10-CM

## 2019-11-30 NOTE — Telephone Encounter (Signed)
Please notify patient the following:  I have placed a Lumbar Spine X-ray for our office - whenever he is ready. During the week (not during lunch hour) he can come in do walk in for X-ray only. Does not need appointment.  Once he has X-ray done, we can do a Virtual visit telephone or video or in person if preferred, and we can discuss next options on Medications (such as Gabapentin for pinched nerve) and or referral to Physical Therapy.  Nobie Putnam, [facility] 11/30/2019, 12:14 PM

## 2019-11-30 NOTE — Telephone Encounter (Signed)
Left message for patient to call back  

## 2019-11-30 NOTE — Telephone Encounter (Signed)
Patient informed as per Dr K. 

## 2019-11-30 NOTE — Telephone Encounter (Signed)
Copied from [place] [id number]. Topic: General - Other >> Nov 30, 2019  8:38 AM Keene Breath wrote: Reason for CRM: Patient called to inform the doctor that the pinched nerve in his back that he was treated for and received medication for, is not getting any better.  He stated that the doctor wanted him to call to let him know how he was feeling and the next step that would be taken if his condition did not improved.  Please advise and CB# [phone]

## 2019-12-01 ENCOUNTER — Ambulatory Visit
Admission: RE | Admit: 2019-12-01 | Discharge: 2019-12-01 | Disposition: A | Discharge status: Home / Self Care | Payer: Medicare HMO | Attending: Family Medicine | Admitting: Family Medicine

## 2019-12-01 ENCOUNTER — Ambulatory Visit
Admission: RE | Admit: 2019-12-01 | Discharge: 2019-12-01 | Disposition: A | Discharge status: Home / Self Care | Payer: Medicare HMO | Source: Ambulatory Visit | Attending: Family Medicine | Admitting: Family Medicine

## 2019-12-01 ENCOUNTER — Other Ambulatory Visit: Payer: Self-pay

## 2019-12-01 DIAGNOSIS — M545 Low back pain: Secondary | ICD-10-CM | POA: Diagnosis not present

## 2019-12-01 DIAGNOSIS — M5441 Lumbago with sciatica, right side: Secondary | ICD-10-CM | POA: Insufficient documentation

## 2019-12-02 NOTE — Progress Notes (Signed)
Patient informed for test result and scheduled his apt for next week Monday virtual.

## 2019-12-06 ENCOUNTER — Telehealth (INDEPENDENT_AMBULATORY_CARE_PROVIDER_SITE_OTHER): Payer: Medicare HMO | Admitting: Family Medicine

## 2019-12-06 ENCOUNTER — Encounter: Payer: Self-pay | Admitting: Family Medicine

## 2019-12-06 ENCOUNTER — Other Ambulatory Visit: Payer: Self-pay

## 2019-12-06 DIAGNOSIS — M5442 Lumbago with sciatica, left side: Secondary | ICD-10-CM

## 2019-12-06 MED ORDER — GABAPENTIN 400 MG PO CAPS: 400.0000 mg | ORAL_CAPSULE | Freq: Three times a day (TID) | ORAL | 1 refills | Status: DC

## 2019-12-06 NOTE — Patient Instructions (Addendum)
Increase Gabapentin as discussed Referral to [facility]  Please schedule a Follow-up Appointment to: Return if symptoms worsen or fail to improve.  If you have any other questions or concerns, please feel free to call the office or send a message through [place]. You may also schedule an earlier appointment if necessary.  Additionally, you may be receiving a survey about your experience at our office within a few days to 1 week by e-mail or mail. We value your feedback.  Nobie Putnam, DO [place]

## 2019-12-06 NOTE — Progress Notes (Signed)
Virtual Visit via Telephone The purpose of this virtual visit is to provide medical care while limiting exposure to the novel coronavirus (COVID19) for both patient and office staff.  Consent was obtained for phone visit:  Yes.   Answered questions that patient had about telehealth interaction:  Yes.   I discussed the limitations, risks, security and privacy concerns of performing an evaluation and management service by telephone. I also discussed with the patient that there may be a patient responsible charge related to this service. The patient expressed understanding and agreed to proceed.  Patient Location: Home Provider Location: [facility])  ---------------------------------------------------------------------- Chief Complaint  Patient presents with  . Back Pain    S: Reviewed CMA documentation. I have called patient and gathered additional HPI as follows:  LOW BACK PAIN, subacute Left sciatica - Last visit with me 11/01/19, for initial visit for same problem without obvious traumatic injury, treated with Prednisone 6 day burst taper, see prior notes for background information. - Interval update with limited improvement we ordered Gabapentin dose titration 100 TID, and Lumbar X-ray done on 6/2 see below with degenerative OA/DJD - Today patient reports limited improvement Still having persistent pain with prolong standing will have episodes of sciatica radiating If resting or elevating leg usually doesn't have any flare up or problem Admits some paresthesia symptoms He is taking Gabapentin 100mg  TID, temporary relief but not quite strong enough dose Taking Tylenol Ext Str 1000mg  3 times a day - History of lumbar OA/DJD - Prior similar back pain flares improved with rest and inversion table. He tried this now but did not help. He also tries back brace as needed -  No difficulty sleeping due to pain. Not bothering him at night. He has seen [employer]  in past for Shoulder but not this problem. - Denies any fevers/chills, numbness, tingling, weakness, loss of control bladder/bowel incontinence or retention, unintentional wt loss, night sweats   Past Medical History:  Diagnosis Date  . Cancer ([place])    skin   Social History   Tobacco Use  . Smoking status: Never Smoker  . Smokeless tobacco: Never Used  Substance Use Topics  . Alcohol use: No  . Drug use: No    Current Outpatient Medications:  .  acetaminophen (TYLENOL) 500 MG chewable tablet, Chew 500 mg by mouth every 6 (six) hours as needed for pain., Disp: , Rfl:  .  aspirin EC 81 MG tablet, Take 1 tablet (81 mg total) daily by mouth., Disp: , Rfl:  .  omeprazole (PRILOSEC) 20 MG capsule, TAKE 1 CAPSULE (20 MG TOTAL) BY MOUTH DAILY BEFORE BREAKFAST., Disp: 90 capsule, Rfl: 1 .  sildenafil (REVATIO) 20 MG tablet, Take 3-5 pills about 30 min prior to sex, Disp: 20 tablet, Rfl: 5 .  valsartan (DIOVAN) 80 MG tablet, TAKE 1 TABLET BY MOUTH EVERY DAY, Disp: 90 tablet, Rfl: 1 .  gabapentin (NEURONTIN) 400 MG capsule, Take 1 capsule (400 mg total) by mouth 3 (three) times daily. May use existing 100mg  capsules to gradually increase dose up to 400mg  3 times a day, increase dose slightly every 3-5 days, Disp: 90 capsule, Rfl: 1  Depression screen [facility] 2/9 08/31/2019 01/05/2019 08/18/2018  Decreased Interest 0 0 0  Down, Depressed, Hopeless 0 0 0  PHQ - 2 Score 0 0 0    No flowsheet data found.  -------------------------------------------------------------------------- O: No physical exam performed due to remote telephone encounter.  Lab results reviewed.  DG Lumbar Spine  CompletePerformed 12/01/2019 Final result  Study Result CLINICAL DATA: Low back pain with left-sided sciatica  EXAM: LUMBAR SPINE - COMPLETE 4+ VIEW  COMPARISON: None.  FINDINGS: Frontal, lateral, spot lumbosacral lateral, and bilateral oblique views were obtained. There are 5 non-rib-bearing lumbar  type vertebral bodies. There is no fracture or spondylolisthesis. There is moderately severe disc space narrowing at L4-5. There is moderate disc space narrowing at L1-2, L2-3, and L3-4. There is facet osteoarthritic change at L3-4, L4-5, and L5-S1 bilaterally, tending to be more severe on the left than on the right. There is aortic atherosclerosis.  IMPRESSION: Osteoarthritic change at several levels. Disc space narrowing most notable at L4-5. No fracture or spondylolisthesis.  Aortic Atherosclerosis (ICD10-I70.0).   Electronically Signed By: Lowella Grip III M.D. On: 12/01/2019 13:55   No results found for this or any previous visit (from the past 2160 hour(s)).  -------------------------------------------------------------------------- A&P:  Problem List Items Addressed This Visit    None    Visit Diagnoses    Acute left-sided low back pain with left-sided sciatica    -  Primary   Relevant Medications   gabapentin (NEURONTIN) 400 MG capsule   Other Relevant Orders   Ambulatory referral to Orthopedic Surgery     Persistent problem with subacute on chronic LBP, with associated L sciatica and paresthesia. Suspect likely due to muscle spasm/strain, without known injury or trauma. In setting of known chronic LBP with DJD - No red flag symptoms. Negative SLR for radiculopathy  Lumbar X-ray 12/01/19 reviewed DJD, disc space narrowing Completed Prednisone temporary relief  Plan: 1. INCREASE Gabapentin from 100mg  TID now inc up to 400mg  TID - gradual dose increase use 100s, can start night-time dose up to 200-400 then inc day time dosing if helpful if tolerated 2. Continue high dose Tylenol 3. For now hold off Muscle relaxant for now 4. Encouraged use of heating pad 1-2x daily for now then PRN  Referral to [facility] orthopedics as next option for further management. May warrant PT / MRI  Orders Placed This Encounter  Procedures  . Ambulatory referral to Orthopedic  Surgery    Referral Priority:   Routine    Referral Type:   Surgical    Referral Reason:   Specialty Services Required    Requested Specialty:   Orthopedic Surgery    Number of Visits Requested:   1    Meds ordered this encounter  Medications  . gabapentin (NEURONTIN) 400 MG capsule    Sig: Take 1 capsule (400 mg total) by mouth 3 (three) times daily. May use existing 100mg  capsules to gradually increase dose up to 400mg  3 times a day, increase dose slightly every 3-5 days    Dispense:  90 capsule    Refill:  1    Follow-up: - Return as needed after orthopedics  Patient verbalizes understanding with the above medical recommendations including the limitation of remote medical advice.  Specific follow-up and call-back criteria were given for patient to follow-up or seek medical care more urgently if needed.   - Time spent in direct consultation with patient on phone: 9 minutes   Nobie Putnam, [facility] 12/06/2019, 2:22 PM

## 2019-12-14 DIAGNOSIS — M5432 Sciatica, left side: Secondary | ICD-10-CM | POA: Diagnosis not present

## 2019-12-27 DIAGNOSIS — M5432 Sciatica, left side: Secondary | ICD-10-CM | POA: Diagnosis not present

## 2019-12-27 DIAGNOSIS — M6281 Muscle weakness (generalized): Secondary | ICD-10-CM | POA: Diagnosis not present

## 2019-12-31 ENCOUNTER — Other Ambulatory Visit: Payer: Medicare HMO

## 2019-12-31 DIAGNOSIS — Z0000 Encounter for general adult medical examination without abnormal findings: Secondary | ICD-10-CM | POA: Diagnosis not present

## 2019-12-31 DIAGNOSIS — E7800 Pure hypercholesterolemia, unspecified: Secondary | ICD-10-CM

## 2019-12-31 DIAGNOSIS — R972 Elevated prostate specific antigen [PSA]: Secondary | ICD-10-CM | POA: Diagnosis not present

## 2019-12-31 DIAGNOSIS — R7303 Prediabetes: Secondary | ICD-10-CM | POA: Diagnosis not present

## 2019-12-31 DIAGNOSIS — I10 Essential (primary) hypertension: Secondary | ICD-10-CM | POA: Diagnosis not present

## 2020-01-01 LAB — PSA: PSA: 2.5 ng/mL (ref <=4.0)

## 2020-01-01 LAB — CBC WITH DIFFERENTIAL/PLATELET
Absolute Monocytes: 655 cells/uL (ref 200–950)
Basophils Absolute: 29 cells/uL (ref 0–200)
Basophils Relative: 0.5 %
Eosinophils Absolute: 180 cells/uL (ref 15–500)
Eosinophils Relative: 3.1 %
HCT: 45.4 % (ref 38.5–50.0)
Hemoglobin: 15.6 g/dL (ref 13.2–17.1)
Lymphs Abs: 2111 cells/uL (ref 850–3900)
MCH: 32 pg (ref 27.0–33.0)
MCHC: 34.4 g/dL (ref 32.0–36.0)
MCV: 93.2 fL (ref 80.0–100.0)
MPV: 10.5 fL (ref 7.5–12.5)
Monocytes Relative: 11.3 %
Neutro Abs: 2825 cells/uL (ref 1500–7800)
Neutrophils Relative %: 48.7 %
Platelets: 202 10*3/uL (ref 140–400)
RBC: 4.87 10*6/uL (ref 4.20–5.80)
RDW: 12.3 % (ref 11.0–15.0)
Total Lymphocyte: 36.4 %
WBC: 5.8 10*3/uL (ref 3.8–10.8)

## 2020-01-01 LAB — COMPLETE METABOLIC PANEL WITH GFR
AG Ratio: 1.7 (calc) (ref 1.0–2.5)
ALT: 20 U/L (ref 9–46)
AST: 17 U/L (ref 10–35)
Albumin: 4 g/dL (ref 3.6–5.1)
Alkaline phosphatase (APISO): 58 U/L (ref 35–144)
BUN: 18 mg/dL (ref 7–25)
CO2: 30 mmol/L (ref 20–32)
Calcium: 9 mg/dL (ref 8.6–10.3)
Chloride: 105 mmol/L (ref 98–110)
Creat: 0.89 mg/dL (ref 0.70–1.25)
GFR, Est African American: 103 mL/min/{1.73_m2} (ref >=60)
GFR, Est Non African American: 89 mL/min/{1.73_m2} (ref >=60)
Globulin: 2.4 g/dL (calc) (ref 1.9–3.7)
Glucose, Bld: 100 mg/dL — ABNORMAL HIGH (ref 65–99)
Potassium: 4.1 mmol/L (ref 3.5–5.3)
Sodium: 140 mmol/L (ref 135–146)
Total Bilirubin: 0.6 mg/dL (ref 0.2–1.2)
Total Protein: 6.4 g/dL (ref 6.1–8.1)

## 2020-01-01 LAB — HEMOGLOBIN A1C
Hgb A1c MFr Bld: 5.8 % of total Hgb — ABNORMAL HIGH (ref <5.7)
Mean Plasma Glucose: 120 (calc)
eAG (mmol/L): 6.6 (calc)

## 2020-01-01 LAB — LIPID PANEL
Cholesterol: 184 mg/dL (ref <200)
HDL: 45 mg/dL (ref >=40)
LDL Cholesterol (Calc): 118 mg/dL (calc) — ABNORMAL HIGH
Non-HDL Cholesterol (Calc): 139 mg/dL (calc) — ABNORMAL HIGH (ref <130)
Total CHOL/HDL Ratio: 4.1 (calc) (ref <5.0)
Triglycerides: 99 mg/dL (ref <150)

## 2020-01-07 ENCOUNTER — Ambulatory Visit (INDEPENDENT_AMBULATORY_CARE_PROVIDER_SITE_OTHER): Payer: Medicare HMO | Admitting: Family Medicine

## 2020-01-07 ENCOUNTER — Other Ambulatory Visit: Payer: Self-pay

## 2020-01-07 ENCOUNTER — Encounter: Payer: Self-pay | Admitting: Family Medicine

## 2020-01-07 VITALS — BP 135/67 | HR 66 | Temp 97.3°F | Resp 16 | Ht 72.0 in | Wt 239.6 lb

## 2020-01-07 DIAGNOSIS — E7800 Pure hypercholesterolemia, unspecified: Secondary | ICD-10-CM

## 2020-01-07 DIAGNOSIS — Z0000 Encounter for general adult medical examination without abnormal findings: Secondary | ICD-10-CM | POA: Diagnosis not present

## 2020-01-07 DIAGNOSIS — Z1211 Encounter for screening for malignant neoplasm of colon: Secondary | ICD-10-CM | POA: Diagnosis not present

## 2020-01-07 DIAGNOSIS — D126 Benign neoplasm of colon, unspecified: Secondary | ICD-10-CM | POA: Diagnosis not present

## 2020-01-07 DIAGNOSIS — R972 Elevated prostate specific antigen [PSA]: Secondary | ICD-10-CM | POA: Diagnosis not present

## 2020-01-07 DIAGNOSIS — N528 Other male erectile dysfunction: Secondary | ICD-10-CM

## 2020-01-07 DIAGNOSIS — E66811 Obesity, class 1: Secondary | ICD-10-CM

## 2020-01-07 DIAGNOSIS — I10 Essential (primary) hypertension: Secondary | ICD-10-CM

## 2020-01-07 DIAGNOSIS — R7303 Prediabetes: Secondary | ICD-10-CM

## 2020-01-07 DIAGNOSIS — E669 Obesity, unspecified: Secondary | ICD-10-CM

## 2020-01-07 MED ORDER — SILDENAFIL CITRATE 20 MG PO TABS: ORAL_TABLET | ORAL | 11 refills | Status: DC

## 2020-01-07 NOTE — Patient Instructions (Addendum)
Thank you for coming to the office today.  Refilled Sildenafil [facility].  Stay tuned for call for apt - Colonoscopy - try to schedule In 03/2020  [facility]) [place], [place] [address] Phone: [phone]  Gabapentin 400 3 times a day, can be tapered gradually when ready - down by 1 pill a week. Can extend to 2 weeks if need, after about a month of reducing down to 1 pill a day only of the 400, let me know we can order the 100mg  pills again to reduce the dose.  1. Chemistry - Normal results, including electrolytes, kidney and liver function. - Normal fasting blood sugar   2. Hemoglobin A1c (Diabetes screening) - 5.8, stable - in range of Pre-Diabetes (>5.7 to 6.4)   3. PSA Prostate Cancer Screening - 2.5, negative.  4. CBC Blood Counts - Normal, no anemia, no other significant abnormality  5. Cholesterol - Mostly normal. Mild elevated LDL 118. Review Statin medications again in office.   DUE for FASTING BLOOD WORK (no food or drink after midnight before the lab appointment, only water or coffee without cream/sugar on the morning of)  SCHEDULE "Lab Only" visit in the morning at the clinic for lab draw in 1 YEAR  - Make sure Lab Only appointment is at about 1 week before your next appointment, so that results will be available  For Lab Results, once available within 2-3 days of blood draw, you can can log in to MyChart online to view your results and a brief explanation. Also, we can discuss results at next follow-up visit.   Please schedule a Follow-up Appointment to: Return in about 1 year (around 01/06/2021) for Annual Physical.  If you have any other questions or concerns, please feel free to call the office or send a message through [place]. You may also schedule an earlier appointment if necessary.  Additionally, you may be receiving a survey about your experience at our office within a few days to 1 week by e-mail  or mail. We value your feedback.  Nobie Putnam, DO [place]

## 2020-01-07 NOTE — Progress Notes (Signed)
Subjective:    Patient ID: Zachary Wall, male    DOB: [birth year], 69 y.o.   MRN: [id number]  Zachary Wall is a 69 y.o. male presenting on 01/07/2020 for Annual Exam   HPI  Here for Annual Physical and Lab Review  Pre-Diabetes/ OBESITY BMI >32 Last lab A1c 5.8, previously 5.6 to 5.8 No new concerns today Lifestyle: -Weight stable - Diet:stable diet, limitingcarbs and sweets,drinks mostly water, 1-2 diet drinks daily - Exercise: increased activity walking Denies hypoglycemia, polyuria, visual changes, numbness or tingling.  CHRONIC HTN: Home BP normal readings Current Meds -Valsartan 80mg  daily Reports good compliance, took meds today. Tolerating well, w/o complaints. Lifestyle: (see below) Denies CP, dyspnea, HA, edema, dizziness / lightheadedness  Erectile Dysfunction Stable chronic problem. He takes Sildenafil up to 3 pills 20mg  each PRN with good results, request refill today  HYPERLIPIDEMIA: - Reports no concerns. Last lipid panel7/2021,mostly unchanged stillcontrolledwith slightly improved LDL down to 118, overall has been consistent. - Currentlynot on Statin - Taking ASA EC 81mg  daily since last visit, tolerating well- he does admit some mild easy bleeding, but doing fine - No known CVD or MI in family history  Additional updates:  Back Pain - Last visit telemedicine 12/06/19, INCREASE Gabapentin from 100mg  TID now inc up to 400mg  TID - gradual dose increase use 100s, can start night-time dose up to 200-400 then inc day time dosing if helpful if tolerated   Health Maintenance:  UTD Benedict 08/2019 and 08/2019.  Prostate CA Screening:Previously followed by [facility] due to elevated PSA in past. Recent results were range of 1 to 2. Last reading PSA 2.5 (12/2019) - previously 1.8 (12/29/18). Currently asymptomaticwithout LUTS. No known family history of prostate CA  UTD colonoscopy - last 2016, per Dr Gaylyn Cheers - scanned into  chart  DUE 5 year, now tubular adenoma, x 1 , referral today [facility]  UTD Hep C and HIV routine screening, negative 03/2017   Depression screen [facility] 2/9 01/07/2020 08/31/2019 01/05/2019  Decreased Interest 0 0 0  Down, Depressed, Hopeless 0 0 0  PHQ - 2 Score 0 0 0    Past Medical History:  Diagnosis Date  . Cancer [facility])    skin   Past Surgical History:  Procedure Laterality Date  . SKIN CANCER EXCISION     Social History   Socioeconomic History  . Marital status: Married    Spouse name: Not on file  . Number of children: Not on file  . Years of education: Not on file  . Highest education level: Not on file  Occupational History  . Not on file  Tobacco Use  . Smoking status: Never Smoker  . Smokeless tobacco: Never Used  Substance and Sexual Activity  . Alcohol use: No  . Drug use: No  . Sexual activity: Yes  Other Topics Concern  . Not on file  Social History Narrative  . Not on file   Social Determinants of Health   Financial Resource Strain:   . Difficulty of Paying Living Expenses:   Food Insecurity:   . Worried About Charity fundraiser in the Last Year:   . Arboriculturist in the Last Year:   Transportation Needs: No Transportation Needs  . Lack of Transportation (Medical): No  . Lack of Transportation (Non-Medical): No  Physical Activity:   . Days of Exercise per Week:   . Minutes of Exercise per Session:   Stress:   .  Feeling of Stress :   Social Connections: Moderately Isolated  . Frequency of Communication with Friends and Family: More than three times a week  . Frequency of Social Gatherings with Friends and Family: More than three times a week  . Attends Religious Services: Never  . Active Member of Clubs or Organizations: No  . Attends Archivist Meetings: Never  . Marital Status: Married  Human resources officer Violence:   . Fear of Current or Ex-Partner:   . Emotionally Abused:   Marland Kitchen Physically Abused:   . Sexually Abused:    Family  History  Problem Relation Age of Onset  . Hematuria Neg Hx   . Prostate cancer Neg Hx   . Tuberculosis Neg Hx   . Kidney cancer Neg Hx   . Bladder Cancer Neg Hx    Current Outpatient Medications on File Prior to Visit  Medication Sig  . acetaminophen (TYLENOL) 500 MG chewable tablet Chew 500 mg by mouth every 6 (six) hours as needed for pain.  Marland Kitchen aspirin EC 81 MG tablet Take 1 tablet (81 mg total) daily by mouth.  . gabapentin (NEURONTIN) 400 MG capsule Take 1 capsule (400 mg total) by mouth 3 (three) times daily. May use existing 100mg  capsules to gradually increase dose up to 400mg  3 times a day, increase dose slightly every 3-5 days  . omeprazole (PRILOSEC) 20 MG capsule TAKE 1 CAPSULE (20 MG TOTAL) BY MOUTH DAILY BEFORE BREAKFAST.  . valsartan (DIOVAN) 80 MG tablet TAKE 1 TABLET BY MOUTH EVERY DAY   No current facility-administered medications on file prior to visit.    Review of Systems  Constitutional: Negative for activity change, appetite change, chills, diaphoresis, fatigue and fever.  HENT: Negative for congestion and hearing loss.   Eyes: Negative for visual disturbance.  Respiratory: Negative for apnea, cough, chest tightness, shortness of breath and wheezing.   Cardiovascular: Negative for chest pain, palpitations and leg swelling.  Gastrointestinal: Negative for abdominal pain, anal bleeding, blood in stool, constipation, diarrhea, nausea and vomiting.  Endocrine: Negative for cold intolerance.  Genitourinary: Negative for difficulty urinating, dysuria, frequency and hematuria.  Musculoskeletal: Negative for arthralgias, back pain and neck pain.  Skin: Negative for rash.  Allergic/Immunologic: Negative for environmental allergies.  Neurological: Negative for dizziness, weakness, light-headedness, numbness and headaches.  Hematological: Negative for adenopathy.  Psychiatric/Behavioral: Negative for behavioral problems, dysphoric mood and sleep disturbance. The patient is  not nervous/anxious.    Per HPI unless specifically indicated above      Objective:    BP 135/67   Pulse 66   Temp (!) 97.3 F (36.3 C) (Temporal)   Resp 16   Ht 6' (1.829 m)   Wt 239 lb 9.6 oz (108.7 kg)   SpO2 98%   BMI 32.50 kg/m   Wt Readings from Last 3 Encounters:  01/07/20 239 lb 9.6 oz (108.7 kg)  11/01/19 238 lb (108 kg)  08/31/19 225 lb (102.1 kg)    Physical Exam Vitals and nursing note reviewed.  Constitutional:      General: He is not in acute distress.    Appearance: He is well-developed. He is not diaphoretic.     Comments: Well-appearing, comfortable, cooperative  HENT:     Head: Normocephalic and atraumatic.  Eyes:     General:        Right eye: No discharge.        Left eye: No discharge.     Conjunctiva/sclera: Conjunctivae normal.  Pupils: Pupils are equal, round, and reactive to light.  Neck:     Thyroid: No thyromegaly.     Vascular: No carotid bruit.  Cardiovascular:     Rate and Rhythm: Normal rate and regular rhythm.     Heart sounds: Normal heart sounds. No murmur heard.   Pulmonary:     Effort: Pulmonary effort is normal. No respiratory distress.     Breath sounds: Normal breath sounds. No wheezing or rales.  Abdominal:     General: Bowel sounds are normal. There is no distension.     Palpations: Abdomen is soft. There is no mass.     Tenderness: There is no abdominal tenderness.  Musculoskeletal:        General: No tenderness. Normal range of motion.     Cervical back: Normal range of motion and neck supple.     Comments: Upper / Lower Extremities: - Normal muscle tone, strength bilateral upper extremities 5/5, lower extremities 5/5  Lymphadenopathy:     Cervical: No cervical adenopathy.  Skin:    General: Skin is warm and dry.     Findings: No erythema or rash.  Neurological:     Mental Status: He is alert and oriented to person, place, and time.     Comments: Distal sensation intact to light touch all extremities    Psychiatric:        Behavior: Behavior normal.     Comments: Well groomed, good eye contact, normal speech and thoughts       Results for orders placed or performed in visit on 12/31/19  PSA  Result Value Ref Range   PSA 2.5 < OR = 4.0 ng/mL  Lipid panel  Result Value Ref Range   Cholesterol 184 <200 mg/dL   HDL 45 > OR = 40 mg/dL   Triglycerides 99 <150 mg/dL   LDL Cholesterol (Calc) 118 (H) mg/dL (calc)   Total CHOL/HDL Ratio 4.1 <5.0 (calc)   Non-HDL Cholesterol (Calc) 139 (H) <130 mg/dL (calc)  COMPLETE METABOLIC PANEL WITH GFR  Result Value Ref Range   Glucose, Bld 100 (H) 65 - 99 mg/dL   BUN 18 7 - 25 mg/dL   Creat 0.89 0.70 - 1.25 mg/dL   GFR, Est Non African American 89 > OR = 60 mL/min/1.60m2   GFR, Est African American 103 > OR = 60 mL/min/1.24m2   BUN/Creatinine Ratio NOT APPLICABLE 6 - 22 (calc)   Sodium 140 135 - 146 mmol/L   Potassium 4.1 3.5 - 5.3 mmol/L   Chloride 105 98 - 110 mmol/L   CO2 30 20 - 32 mmol/L   Calcium 9.0 8.6 - 10.3 mg/dL   Total Protein 6.4 6.1 - 8.1 g/dL   Albumin 4.0 3.6 - 5.1 g/dL   Globulin 2.4 1.9 - 3.7 g/dL (calc)   AG Ratio 1.7 1.0 - 2.5 (calc)   Total Bilirubin 0.6 0.2 - 1.2 mg/dL   Alkaline phosphatase (APISO) 58 35 - 144 U/L   AST 17 10 - 35 U/L   ALT 20 9 - 46 U/L  CBC with Differential/Platelet  Result Value Ref Range   WBC 5.8 3.8 - 10.8 Thousand/uL   RBC 4.87 4.20 - 5.80 Million/uL   Hemoglobin 15.6 13.2 - 17.1 g/dL   HCT 45.4 38 - 50 %   MCV 93.2 80.0 - 100.0 fL   MCH 32.0 27.0 - 33.0 pg   MCHC 34.4 32.0 - 36.0 g/dL   RDW 12.3 11.0 - 15.0 %  Platelets 202 140 - 400 Thousand/uL   MPV 10.5 7.5 - 12.5 fL   Neutro Abs 2,825 1,500 - 7,800 cells/uL   Lymphs Abs 2,111 850 - 3,900 cells/uL   Absolute Monocytes 655 200 - 950 cells/uL   Eosinophils Absolute 180 15 - 500 cells/uL   Basophils Absolute 29 0 - 200 cells/uL   Neutrophils Relative % 48.7 %   Total Lymphocyte 36.4 %   Monocytes Relative 11.3 %   Eosinophils  Relative 3.1 %   Basophils Relative 0.5 %  Hemoglobin A1c  Result Value Ref Range   Hgb A1c MFr Bld 5.8 (H) <5.7 % of total Hgb   Mean Plasma Glucose 120 (calc)   eAG (mmol/L) 6.6 (calc)      Assessment & Plan:   Problem List Items Addressed This Visit    Pre-diabetes    Stable PreDM A1c 5.8 With HLD and HTN, fairly well controlled overall Wt stable  Plan:  1. Not on any therapy currently  2. Encourage improved lifestyle - low carb, low sugar diet, reduce portion size, continue improving regular exercise      Other male erectile dysfunction   Relevant Medications   sildenafil (REVATIO) 20 MG tablet   Obesity (BMI 30.0-34.9)    Encourage continue weight loss lifestyle      Hyperlipidemia    Mild elevated LDL, stable from prior result Last lipid panel 12/2019 Calculated ASCVD 10 yr risk score 11%  Plan: 1. Reviewed ASCVD risk and offered statin therapy - patient declines again at this time, prefers to only take ASA for now and improve lifestyle, understands counseling on benefits and risk reduction of statin - - Today he will decline statin therapy may reconsider within next few year 2. Continue ASA 81mg  for primary ASCVD risk reduction 3. Encourage improved lifestyle - low carb/cholesterol, reduce portion size, continue improving regular exercise 4. Follow-up yearly lipids      Relevant Medications   sildenafil (REVATIO) 20 MG tablet   Essential hypertension    Controlled BP Home reviewed No known complications  Failed Lisinopril (ACEi cough)    Plan:  1. Continue Valsartan 80mg  daily 2. Encourage improved lifestyle - low sodium diet, regular exercise 3. Continue monitor BP outside office, bring readings to next visit, if persistently >140/90 or new symptoms notify office sooner      Relevant Medications   sildenafil (REVATIO) 20 MG tablet   Elevated PSA    Low risk patient, with prior reported negative screening - PSA 1 to 2 in past, has seen Urology [facility] -  Last PSA 2.5 (20221) - Last DRE 30g prostate w/o abnormality, 05/2016 per Urology - Clinically asymptomatic  Plan: 1. Reviewed prostate cancer screening guidelines and risks including potential prostate biopsy if abnormal PSA - may need to return to Urology if PSA actually does trend up - Proceed with yearly PSA for now, and anticipate DRE as needed especially if abnormal PSA or new symptoms  Does not need to return to [facility] for prostate monitoring       Other Visit Diagnoses    Annual physical exam    -  Primary   Tubular adenoma of colon       Relevant Orders   Ambulatory referral to Gastroenterology   Colon cancer screening       Relevant Orders   Ambulatory referral to Gastroenterology      Updated Health Maintenance information - Due for next Colonoscopy 03/2020, referral placed. - PSA yearly, negative  Reviewed recent lab results with patient Encouraged improvement to lifestyle with diet and exercise - Goal of weight loss  Orders Placed This Encounter  Procedures  . Ambulatory referral to Gastroenterology    Referral Priority:   Routine    Referral Type:   Consultation    Referral Reason:   Specialty Services Required    Number of Visits Requested:   1     Meds ordered this encounter  Medications  . sildenafil (REVATIO) 20 MG tablet    Sig: Take 3-5 pills about 30 min prior to sex    Dispense:  20 tablet    Refill:  11    goodrx      Follow up plan: Return in about 1 year (around 01/06/2021) for Annual Physical.  Future labs for 01/02/21  Nobie Putnam, [facility] 01/07/2020, 2:43 PM

## 2020-01-09 ENCOUNTER — Other Ambulatory Visit: Payer: Self-pay | Admitting: Family Medicine

## 2020-01-09 DIAGNOSIS — Z0000 Encounter for general adult medical examination without abnormal findings: Secondary | ICD-10-CM

## 2020-01-09 DIAGNOSIS — E669 Obesity, unspecified: Secondary | ICD-10-CM

## 2020-01-09 DIAGNOSIS — R7303 Prediabetes: Secondary | ICD-10-CM

## 2020-01-09 DIAGNOSIS — I10 Essential (primary) hypertension: Secondary | ICD-10-CM

## 2020-01-09 DIAGNOSIS — R972 Elevated prostate specific antigen [PSA]: Secondary | ICD-10-CM

## 2020-01-09 DIAGNOSIS — E7800 Pure hypercholesterolemia, unspecified: Secondary | ICD-10-CM

## 2020-01-09 NOTE — Assessment & Plan Note (Signed)
Encourage continue weight loss lifestyle

## 2020-01-09 NOTE — Assessment & Plan Note (Signed)
Mild elevated LDL, stable from prior result Last lipid panel 12/2019 Calculated ASCVD 10 yr risk score 11%  Plan: 1. Reviewed ASCVD risk and offered statin therapy - patient declines again at this time, prefers to only take ASA for now and improve lifestyle, understands counseling on benefits and risk reduction of statin - - Today he will decline statin therapy may reconsider within next few year 2. Continue ASA 81mg  for primary ASCVD risk reduction 3. Encourage improved lifestyle - low carb/cholesterol, reduce portion size, continue improving regular exercise 4. Follow-up yearly lipids

## 2020-01-09 NOTE — Assessment & Plan Note (Signed)
Stable PreDM A1c 5.8 With HLD and HTN, fairly well controlled overall Wt stable  Plan:  1. Not on any therapy currently  2. Encourage improved lifestyle - low carb, low sugar diet, reduce portion size, continue improving regular exercise

## 2020-01-09 NOTE — Assessment & Plan Note (Signed)
Low risk patient, with prior reported negative screening - PSA 1 to 2 in past, has seen Urology [facility] - Last PSA 2.5 (20221) - Last DRE 30g prostate w/o abnormality, 05/2016 per Urology - Clinically asymptomatic  Plan: 1. Reviewed prostate cancer screening guidelines and risks including potential prostate biopsy if abnormal PSA - may need to return to Urology if PSA actually does trend up - Proceed with yearly PSA for now, and anticipate DRE as needed especially if abnormal PSA or new symptoms  Does not need to return to [facility] for prostate monitoring

## 2020-01-09 NOTE — Assessment & Plan Note (Signed)
Controlled BP Home reviewed No known complications  Failed Lisinopril (ACEi cough)    Plan:  1. Continue Valsartan 80mg daily 2. Encourage improved lifestyle - low sodium diet, regular exercise 3. Continue monitor BP outside office, bring readings to next visit, if persistently >140/90 or new symptoms notify office sooner 

## 2020-01-19 ENCOUNTER — Telehealth (INDEPENDENT_AMBULATORY_CARE_PROVIDER_SITE_OTHER): Payer: Self-pay | Admitting: Gastroenterology

## 2020-01-19 ENCOUNTER — Other Ambulatory Visit: Payer: Self-pay

## 2020-01-19 DIAGNOSIS — D126 Benign neoplasm of colon, unspecified: Secondary | ICD-10-CM

## 2020-01-19 DIAGNOSIS — Z1211 Encounter for screening for malignant neoplasm of colon: Secondary | ICD-10-CM

## 2020-01-19 NOTE — Progress Notes (Signed)
Gastroenterology Pre-Procedure Review  Request Date: Friday 03/10/20 Requesting Physician: Dr. Vicente Males  PATIENT REVIEW QUESTIONS: The patient responded to the following health history questions as indicated:    1. Are you having any GI issues? no 2. Do you have a personal history of Polyps? yes (12/13/14 Dr. Vira Agar performed colonoscopy polyps were noted) 3. Do you have a family history of Colon Cancer or Polyps? yes (one child had colon polyps) 4. Diabetes Mellitus? no 5. Joint replacements in the past 12 months?no 6. Major health problems in the past 3 months?no 7. Any artificial heart valves, MVP, or defibrillator?no    MEDICATIONS & ALLERGIES:    Patient reports the following regarding taking any anticoagulation/antiplatelet therapy:   Plavix, Coumadin, Eliquis, Xarelto, Lovenox, Pradaxa, Brilinta, or Effient? no Aspirin? Yes 81 mg daily  Patient confirms/reports the following medications:  Current Outpatient Medications  Medication Sig Dispense Refill  . acetaminophen (TYLENOL) 500 MG chewable tablet Chew 500 mg by mouth every 6 (six) hours as needed for pain.    Marland Kitchen aspirin EC 81 MG tablet Take 1 tablet (81 mg total) daily by mouth.    . gabapentin (NEURONTIN) 400 MG capsule Take 1 capsule (400 mg total) by mouth 3 (three) times daily. May use existing 100mg  capsules to gradually increase dose up to 400mg  3 times a day, increase dose slightly every 3-5 days 90 capsule 1  . omeprazole (PRILOSEC) 20 MG capsule TAKE 1 CAPSULE (20 MG TOTAL) BY MOUTH DAILY BEFORE BREAKFAST. 90 capsule 1  . sildenafil (REVATIO) 20 MG tablet Take 3-5 pills about 30 min prior to sex 20 tablet 11  . valsartan (DIOVAN) 80 MG tablet TAKE 1 TABLET BY MOUTH EVERY DAY 90 tablet 1   No current facility-administered medications for this visit.    Patient confirms/reports the following allergies:  Allergies  Allergen Reactions  . Ace Inhibitors Cough  . Azithromycin Rash    No orders of the defined types  were placed in this encounter.   AUTHORIZATION INFORMATION Primary Insurance: 1D#: Group #:  Secondary Insurance: 1D#: Group #:  SCHEDULE INFORMATION: Date: Friday 03/10/20 Time: Location:[facility]

## 2020-02-25 ENCOUNTER — Other Ambulatory Visit: Payer: Self-pay | Admitting: Family Medicine

## 2020-02-25 DIAGNOSIS — K219 Gastro-esophageal reflux disease without esophagitis: Secondary | ICD-10-CM

## 2020-02-25 NOTE — Telephone Encounter (Signed)
Requested  medications are  due for refill today yes  Requested medications are on the active medication list yes  Last refill 5/31  Last visit July 2021  Future visit scheduled YES 12/2020  Notes to clinic Do not see med/dx addressed in office visit, please assess.Marland Kitchen

## 2020-02-29 ENCOUNTER — Other Ambulatory Visit: Payer: Self-pay | Admitting: Family Medicine

## 2020-02-29 ENCOUNTER — Telehealth: Payer: Self-pay | Admitting: Gastroenterology

## 2020-02-29 DIAGNOSIS — M5442 Lumbago with sciatica, left side: Secondary | ICD-10-CM

## 2020-02-29 MED ORDER — GABAPENTIN 100 MG PO CAPS: 100.0000 mg | ORAL_CAPSULE | Freq: Every day | ORAL | 1 refills | Status: DC

## 2020-02-29 NOTE — Telephone Encounter (Signed)
Copied from [place] [id number]. Topic: General - Other >> Feb 29, 2020  9:18 AM Rainey Pines A wrote: Patient was advised to call in by PCP once he was ready to start taking 100mg  of gabapentin as they are working on decreasing his dosage. Patient stated that he would like the new prescription sent to [id number] - [place], [address]  Phone:  [phone] Fax:  [phone]

## 2020-02-29 NOTE — Telephone Encounter (Signed)
Patient states due to procedure being at [facility] he does not feel comfortable having it done with the rising number of covid pts. Pt would like to resch to later date. Please call pt back to resch. Pt had virtual screening on 7.21.21.

## 2020-02-29 NOTE — Telephone Encounter (Signed)
Triage CMA [facility] sent pt a Mychart message to resch appt.

## 2020-03-08 ENCOUNTER — Other Ambulatory Visit: Admission: RE | Admit: 2020-03-08 | Payer: Medicare HMO | Source: Ambulatory Visit

## 2020-03-09 ENCOUNTER — Other Ambulatory Visit: Payer: Self-pay | Admitting: Family Medicine

## 2020-03-09 DIAGNOSIS — I10 Essential (primary) hypertension: Secondary | ICD-10-CM

## 2020-03-10 ENCOUNTER — Encounter: Admission: RE | Payer: Self-pay | Source: Home / Self Care

## 2020-03-10 ENCOUNTER — Ambulatory Visit: Admission: RE | Admit: 2020-03-10 | Payer: Medicare HMO | Source: Home / Self Care | Admitting: Gastroenterology

## 2020-03-10 SURGERY — COLONOSCOPY WITH PROPOFOL
Anesthesia: General

## 2020-05-29 DIAGNOSIS — R69 Illness, unspecified: Secondary | ICD-10-CM | POA: Diagnosis not present

## 2020-06-02 ENCOUNTER — Other Ambulatory Visit: Payer: Self-pay | Admitting: Family Medicine

## 2020-06-02 DIAGNOSIS — I10 Essential (primary) hypertension: Secondary | ICD-10-CM

## 2020-08-09 ENCOUNTER — Other Ambulatory Visit: Payer: Self-pay

## 2020-08-09 ENCOUNTER — Encounter: Payer: Self-pay | Admitting: Dermatology

## 2020-08-09 ENCOUNTER — Ambulatory Visit: Payer: Medicare HMO | Admitting: Dermatology

## 2020-08-09 DIAGNOSIS — L570 Actinic keratosis: Secondary | ICD-10-CM | POA: Diagnosis not present

## 2020-08-09 DIAGNOSIS — L814 Other melanin hyperpigmentation: Secondary | ICD-10-CM

## 2020-08-09 DIAGNOSIS — D1800 Hemangioma unspecified site: Secondary | ICD-10-CM

## 2020-08-09 DIAGNOSIS — Z86007 Personal history of in-situ neoplasm of skin: Secondary | ICD-10-CM | POA: Diagnosis not present

## 2020-08-09 DIAGNOSIS — L918 Other hypertrophic disorders of the skin: Secondary | ICD-10-CM | POA: Diagnosis not present

## 2020-08-09 DIAGNOSIS — D229 Melanocytic nevi, unspecified: Secondary | ICD-10-CM | POA: Diagnosis not present

## 2020-08-09 DIAGNOSIS — L821 Other seborrheic keratosis: Secondary | ICD-10-CM

## 2020-08-09 DIAGNOSIS — L578 Other skin changes due to chronic exposure to nonionizing radiation: Secondary | ICD-10-CM

## 2020-08-09 DIAGNOSIS — Z1283 Encounter for screening for malignant neoplasm of skin: Secondary | ICD-10-CM

## 2020-08-09 NOTE — Progress Notes (Signed)
° °  Follow-Up Visit   Subjective  Zachary Wall is a 70 y.o. male who presents for the following: Total body skin exam (Hx of SCCI S L prox volar forearm, hx AKs). The patient presents for Total-Body Skin Exam (TBSE) for skin cancer screening and mole check.  The following portions of the chart were reviewed this encounter and updated as appropriate:   Tobacco   Allergies   Meds   Problems   Med Hx   Surg Hx   Fam Hx      Review of Systems:  No other skin or systemic complaints except as noted in HPI or Assessment and Plan.  Objective  Well appearing patient in no apparent distress; mood and affect are within normal limits.  A full examination was performed including scalp, head, eyes, ears, nose, lips, neck, chest, axillae, abdomen, back, buttocks, bilateral upper extremities, bilateral lower extremities, hands, feet, fingers, toes, fingernails, and toenails. All findings within normal limits unless otherwise noted below.  Objective  Left proximal volar forearm: Well healed scar with no evidence of recurrence  Objective  face x 2 (2): Pink scaly macules    Assessment & Plan    Lentigines - Scattered tan macules - Discussed due to sun exposure - Benign, observe - Call for any changes  Seborrheic Keratoses - Stuck-on, waxy, tan-brown papules and plaques  - Discussed benign etiology and prognosis. - Observe - Call for any changes  Melanocytic Nevi - Tan-brown and/or pink-flesh-colored symmetric macules and papules - Benign appearing on exam today - Observation - Call clinic for new or changing moles - Recommend daily use of broad spectrum spf 30+ sunscreen to sun-exposed areas.   Hemangiomas - Red papules - Discussed benign nature - Observe - Call for any changes  Actinic Damage Severe - Chronic, secondary to cumulative UV/sun exposure - diffuse scaly erythematous macules with underlying dyspigmentation - Recommend daily broad spectrum sunscreen SPF 30+ to  sun-exposed areas, reapply every 2 hours as needed.  - Call for new or changing lesions.  Skin cancer screening performed today.  Acrochordons (Skin Tags) - Fleshy, skin-colored pedunculated papules - Benign appearing.  - Observe. - If desired, they can be removed with an in office procedure that is not covered by insurance. - Please call the clinic if you notice any new or changing lesions.  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 Observe for recurrence. Call clinic for new or changing lesions.  Recommend regular skin exams, daily broad-spectrum spf 30+ sunscreen use, and photoprotection.     AK (actinic keratosis) (2) face x 2  Destruction of lesion - face x 2 Complexity: simple   Destruction method: cryotherapy   Informed consent: discussed and consent obtained   Timeout:  patient name, date of birth, surgical site, and procedure verified Lesion destroyed using liquid nitrogen: Yes   Region frozen until ice ball extended beyond lesion: Yes   Outcome: patient tolerated procedure well with no complications   Post-procedure details: wound care instructions given    Skin cancer screening  Return in about 1 year (around 08/09/2021) for TBSE, Hx of SCC IS, Hx of AKs.  I, [name], RMA, am acting as scribe for Sarina Ser, MD .  Documentation: I have reviewed the above documentation for accuracy and completeness, and I agree with the above.  Sarina Ser, MD

## 2020-08-12 ENCOUNTER — Encounter: Payer: Self-pay | Admitting: Dermatology

## 2020-08-18 ENCOUNTER — Other Ambulatory Visit: Payer: Self-pay | Admitting: Family Medicine

## 2020-08-18 DIAGNOSIS — K219 Gastro-esophageal reflux disease without esophagitis: Secondary | ICD-10-CM

## 2020-08-18 NOTE — Telephone Encounter (Signed)
Requested medications are due for refill today yes  Requested medications are on the active medication list yes  Last refill 11/24  Last visit I do not see this med/dx addressed in OV note  Future visit scheduled 08/2020  Notes to clinic Failed protocol due to no valid visit within 12  months.

## 2020-08-29 ENCOUNTER — Other Ambulatory Visit: Payer: Self-pay | Admitting: Family Medicine

## 2020-08-29 DIAGNOSIS — I10 Essential (primary) hypertension: Secondary | ICD-10-CM

## 2020-09-19 ENCOUNTER — Ambulatory Visit (INDEPENDENT_AMBULATORY_CARE_PROVIDER_SITE_OTHER): Payer: Medicare HMO

## 2020-09-19 VITALS — Ht 72.0 in | Wt 230.0 lb

## 2020-09-19 DIAGNOSIS — Z0000 Encounter for general adult medical examination without abnormal findings: Secondary | ICD-10-CM

## 2020-09-19 NOTE — Patient Instructions (Signed)
[name] , Thank you for taking time to come for your Medicare Wellness Visit. I appreciate your ongoing commitment to your health goals. Please review the following plan we discussed and let me know if I can assist you in the future.   Screening recommendations/referrals: Colonoscopy: patient to schedule Recommended yearly ophthalmology/optometry visit for glaucoma screening and checkup Recommended yearly dental visit for hygiene and checkup  Vaccinations: Influenza vaccine: decline Pneumococcal vaccine: decline Tdap vaccine: completed 12/13/2014, due 12/12/2024 Shingles vaccine: discussed   Covid-19:  04/17/2020, 09/14/2019, 08/25/2019  Advanced directives: Please bring a copy of your POA (Power of Attorney) and/or Living Will to your next appointment.   Conditions/risks identified: none  Next appointment: Follow up in one year for your annual wellness visit.   Preventive Care 68 Years and Older, Male Preventive care refers to lifestyle choices and visits with your health care provider that can promote health and wellness. What does preventive care include?  A yearly physical exam. This is also called an annual well check.  Dental exams once or twice a year.  Routine eye exams. Ask your health care provider how often you should have your eyes checked.  Personal lifestyle choices, including:  Daily care of your teeth and gums.  Regular physical activity.  Eating a healthy diet.  Avoiding tobacco and drug use.  Limiting alcohol use.  Practicing safe sex.  Taking low doses of aspirin every day.  Taking vitamin and mineral supplements as recommended by your health care provider. What happens during an annual well check? The services and screenings done by your health care provider during your annual well check will depend on your age, overall health, lifestyle risk factors, and family history of disease. Counseling  Your health care provider may ask you questions about  your:  Alcohol use.  Tobacco use.  Drug use.  Emotional well-being.  Home and relationship well-being.  Sexual activity.  Eating habits.  History of falls.  Memory and ability to understand (cognition).  Work and work Statistician. Screening  You may have the following tests or measurements:  Height, weight, and BMI.  Blood pressure.  Lipid and cholesterol levels. These may be checked every 5 years, or more frequently if you are over 37 years old.  Skin check.  Lung cancer screening. You may have this screening every year starting at age 52 if you have a 30-pack-year history of smoking and currently smoke or have quit within the past 15 years.  Fecal occult blood test (FOBT) of the stool. You may have this test every year starting at age 11.  Flexible sigmoidoscopy or colonoscopy. You may have a sigmoidoscopy every 5 years or a colonoscopy every 10 years starting at age 11.  Prostate cancer screening. Recommendations will vary depending on your family history and other risks.  Hepatitis C blood test.  Hepatitis B blood test.  Sexually transmitted disease (STD) testing.  Diabetes screening. This is done by checking your blood sugar (glucose) after you have not eaten for a while (fasting). You may have this done every 1-3 years.  Abdominal aortic aneurysm (AAA) screening. You may need this if you are a current or former smoker.  Osteoporosis. You may be screened starting at age 72 if you are at high risk. Talk with your health care provider about your test results, treatment options, and if necessary, the need for more tests. Vaccines  Your health care provider may recommend certain vaccines, such as:  Influenza vaccine. This is recommended every  year.  Tetanus, diphtheria, and acellular pertussis (Tdap, Td) vaccine. You may need a Td booster every 10 years.  Zoster vaccine. You may need this after age 5.  Pneumococcal 13-valent conjugate (PCV13) vaccine.  One dose is recommended after age 79.  Pneumococcal polysaccharide (PPSV23) vaccine. One dose is recommended after age 78. Talk to your health care provider about which screenings and vaccines you need and how often you need them. This information is not intended to replace advice given to you by your health care provider. Make sure you discuss any questions you have with your health care provider. Document Released: 07/14/2015 Document Revised: 03/06/2016 Document Reviewed: 04/18/2015 Elsevier Interactive Patient Education  2017 [place] Prevention in the Home Falls can cause injuries. They can happen to people of all ages. There are many things you can do to make your home safe and to help prevent falls. What can I do on the outside of my home?  Regularly fix the edges of walkways and driveways and fix any cracks.  Remove anything that might make you trip as you walk through a door, such as a raised step or threshold.  Trim any bushes or trees on the path to your home.  Use bright outdoor lighting.  Clear any walking paths of anything that might make someone trip, such as rocks or tools.  Regularly check to see if handrails are loose or broken. Make sure that both sides of any steps have handrails.  Any raised decks and porches should have guardrails on the edges.  Have any leaves, snow, or ice cleared regularly.  Use sand or salt on walking paths during winter.  Clean up any spills in your garage right away. This includes oil or grease spills. What can I do in the bathroom?  Use night lights.  Install grab bars by the toilet and in the tub and shower. Do not use towel bars as grab bars.  Use non-skid mats or decals in the tub or shower.  If you need to sit down in the shower, use a plastic, non-slip stool.  Keep the floor dry. Clean up any water that spills on the floor as soon as it happens.  Remove soap buildup in the tub or shower regularly.  Attach bath  mats securely with double-sided non-slip rug tape.  Do not have throw rugs and other things on the floor that can make you trip. What can I do in the bedroom?  Use night lights.  Make sure that you have a light by your bed that is easy to reach.  Do not use any sheets or blankets that are too big for your bed. They should not hang down onto the floor.  Have a firm chair that has side arms. You can use this for support while you get dressed.  Do not have throw rugs and other things on the floor that can make you trip. What can I do in the kitchen?  Clean up any spills right away.  Avoid walking on wet floors.  Keep items that you use a lot in easy-to-reach places.  If you need to reach something above you, use a strong step stool that has a grab bar.  Keep electrical cords out of the way.  Do not use floor polish or wax that makes floors slippery. If you must use wax, use non-skid floor wax.  Do not have throw rugs and other things on the floor that can make you trip. What can  I do with my stairs?  Do not leave any items on the stairs.  Make sure that there are handrails on both sides of the stairs and use them. Fix handrails that are broken or loose. Make sure that handrails are as long as the stairways.  Check any carpeting to make sure that it is firmly attached to the stairs. Fix any carpet that is loose or worn.  Avoid having throw rugs at the top or bottom of the stairs. If you do have throw rugs, attach them to the floor with carpet tape.  Make sure that you have a light switch at the top of the stairs and the bottom of the stairs. If you do not have them, ask someone to add them for you. What else can I do to help prevent falls?  Wear shoes that:  Do not have high heels.  Have rubber bottoms.  Are comfortable and fit you well.  Are closed at the toe. Do not wear sandals.  If you use a stepladder:  Make sure that it is fully opened. Do not climb a closed  stepladder.  Make sure that both sides of the stepladder are locked into place.  Ask someone to hold it for you, if possible.  Clearly mark and make sure that you can see:  Any grab bars or handrails.  First and last steps.  Where the edge of each step is.  Use tools that help you move around (mobility aids) if they are needed. These include:  Canes.  Walkers.  Scooters.  Crutches.  Turn on the lights when you go into a dark area. Replace any light bulbs as soon as they burn out.  Set up your furniture so you have a clear path. Avoid moving your furniture around.  If any of your floors are uneven, fix them.  If there are any pets around you, be aware of where they are.  Review your medicines with your doctor. Some medicines can make you feel dizzy. This can increase your chance of falling. Ask your doctor what other things that you can do to help prevent falls. This information is not intended to replace advice given to you by your health care provider. Make sure you discuss any questions you have with your health care provider. Document Released: 04/13/2009 Document Revised: 11/23/2015 Document Reviewed: 07/22/2014 Elsevier Interactive Patient Education  2017 [employer].

## 2020-09-19 NOTE — Progress Notes (Signed)
I connected with Zachary Wall today by telephone and verified that I am speaking with the correct person using two identifiers. Location patient: home Location provider: work Persons participating in the virtual visit: Javonn, Gauger LPN.   I discussed the limitations, risks, security and privacy concerns of performing an evaluation and management service by telephone and the availability of in person appointments. I also discussed with the patient that there may be a patient responsible charge related to this service. The patient expressed understanding and verbally consented to this telephonic visit.    Interactive audio and video telecommunications were attempted between this provider and patient, however failed, due to patient having technical difficulties OR patient did not have access to video capability.  We continued and completed visit with audio only.     Vital signs may be patient reported or missing.  Subjective:   Zachary Wall is a 70 y.o. male who presents for Medicare Annual/Subsequent preventive examination.  Review of Systems     Cardiac Risk Factors include: advanced age (>26men, >69 women);dyslipidemia;hypertension;male gender;obesity (BMI >30kg/m2)     Objective:    Today's Vitals   09/19/20 0816  Weight: 230 lb (104.3 kg)  Height: 6' (1.829 m)   Body mass index is 31.19 kg/m.  Advanced Directives 09/19/2020 08/18/2018  Does Patient Have a Medical Advance Directive? Yes No  Type of Paramedic of [place];Living will -  Copy of [place] in Chart? No - copy requested -    Current Medications (verified) Outpatient Encounter Medications as of 09/19/2020  Medication Sig  . acetaminophen (TYLENOL) 500 MG chewable tablet Chew 500 mg by mouth every 6 (six) hours as needed for pain.  Marland Kitchen aspirin EC 81 MG tablet Take 1 tablet (81 mg total) daily by mouth.  . gabapentin (NEURONTIN) 100 MG capsule Take 1-3  capsules (100-300 mg total) by mouth at bedtime. Taper dose down from 300mg  (3 capsules) daily down by 1 capsule every few weeks, may reduce down to 100mg  daily, or eventually taper off.  . omeprazole (PRILOSEC) 20 MG capsule TAKE 1 CAPSULE (20 MG TOTAL) BY MOUTH DAILY BEFORE BREAKFAST.  . valsartan (DIOVAN) 80 MG tablet TAKE 1 TABLET BY MOUTH EVERY DAY  . sildenafil (REVATIO) 20 MG tablet Take 3-5 pills about 30 min prior to sex (Patient not taking: Reported on 09/19/2020)   No facility-administered encounter medications on file as of 09/19/2020.    Allergies (verified) Ace inhibitors and Azithromycin   History: Past Medical History:  Diagnosis Date  . Actinic keratosis   . Cancer ([place])    skin  . 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 SKIN CANCER EXCISION     Family History  Problem Relation Age of Onset  . Hematuria Neg Hx   . Prostate cancer Neg Hx   . Tuberculosis Neg Hx   . Kidney cancer Neg Hx   . Bladder Cancer Neg Hx    Social History   Socioeconomic History  . Marital status: Married    Spouse name: Not on file  . Number of children: Not on file  . Years of education: Not on file  . Highest education level: Not on file  Occupational History  . Not on file  Tobacco Use  . Smoking status: Never Smoker  . Smokeless tobacco: Never Used  Substance and Sexual Activity  .  Alcohol use: No  . Drug use: No  . Sexual activity: Yes  Other Topics Concern  . Not on file  Social History Narrative  . Not on file   Social Determinants of Health   Financial Resource Strain: Low Risk   . Difficulty of Paying Living Expenses: Not hard at all  Food Insecurity: No Food Insecurity  . Worried About Charity fundraiser in the Last Year: Never true  . Ran Out of Food in the Last Year: Never true  Transportation Needs: No Transportation Needs  . Lack of Transportation (Medical): No   . Lack of Transportation (Non-Medical): No  Physical Activity: Sufficiently Active  . Days of Exercise per Week: 7 days  . Minutes of Exercise per Session: 30 min  Stress: No Stress Concern Present  . Feeling of Stress : Not at all  Social Connections: Not on file    Tobacco Counseling Counseling given: Not Answered   Clinical Intake:  Pre-visit preparation completed: Yes  Pain : No/denies pain     Nutritional Status: BMI > 30  Obese Nutritional Risks: None Diabetes: No  How often do you need to have someone help you when you read instructions, pamphlets, or other written materials from your doctor or pharmacy?: 1 - Never What is the last grade level you completed in school?: 12th grade  Diabetic? no  Interpreter Needed?: No  Information entered by :: NAllen LPN   Activities of Daily Living In your present state of health, do you have any difficulty performing the following activities: 09/19/2020  Hearing? N  Vision? N  Difficulty concentrating or making decisions? N  Walking or climbing stairs? N  Dressing or bathing? N  Doing errands, shopping? N  Preparing Food and eating ? N  Using the Toilet? N  In the past six months, have you accidently leaked urine? N  Do you have problems with loss of bowel control? N  Managing your Medications? N  Managing your Finances? N  Housekeeping or managing your Housekeeping? N  Some recent data might be hidden    Patient Care Team: Olin Hauser, DO as PCP - General (Family Medicine)  Indicate any recent Medical Services you may have received from other than [facility] providers in the past year (date may be approximate).     Assessment:   This is a routine wellness examination for Zachary Wall.  Hearing/Vision screen  Hearing Screening   125Hz  250Hz  500Hz  1000Hz  2000Hz  3000Hz  4000Hz  6000Hz  8000Hz   Right ear:           Left ear:           Vision Screening Comments: Regular eye exams, [place]  Dietary  issues and exercise activities discussed: Current Exercise Habits: Home exercise routine, Type of exercise: walking;strength training/weights, Time (Minutes): 30, Frequency (Times/Week): 7, Weekly Exercise (Minutes/Week): 210  Goals    . Patient Stated     09/19/2020, no goals    . Weight (lb) < 200 lb (90.7 kg)      Depression Screen PHQ 2/9 Scores 09/19/2020 01/07/2020 08/31/2019 01/05/2019 08/18/2018 05/18/2018 11/12/2017  PHQ - 2 Score 0 0 0 0 0 0 0    Fall Risk Fall Risk  09/19/2020 01/07/2020 08/31/2019 01/05/2019 08/18/2018  Falls in the past year? 0 0 0 0 0  Number falls in past yr: - 0 0 - -  Injury with Fall? - 0 0 - -  Risk for fall due to : Medication side effect - - - -  Follow up Falls evaluation completed;Education provided;Falls prevention discussed Falls evaluation completed - Falls evaluation completed -    FALL RISK PREVENTION PERTAINING TO THE HOME:  Any stairs in or around the home? Yes  If so, are there any without handrails? No  Home free of loose throw rugs in walkways, pet beds, electrical cords, etc? Yes  Adequate lighting in your home to reduce risk of falls? Yes   ASSISTIVE DEVICES UTILIZED TO PREVENT FALLS:  Life alert? No  Use of a cane, walker or w/c? No  Grab bars in the bathroom? Yes  Shower chair or bench in shower? Yes  Elevated toilet seat or a handicapped toilet? Yes   TIMED UP AND GO:  Was the test performed? No .     Cognitive Function: MMSE - Mini Mental State Exam 08/18/2018  Orientation to time 5  Orientation to Place 5  Registration 3  Attention/ Calculation 5  Recall 3  Language- name 2 objects 2  Language- repeat 1  Language- follow 3 step command 3  Language- read & follow direction 1  Write a sentence 1  Copy design 1  Total score 30     6CIT Screen 09/19/2020  What Year? 0 points  What month? 0 points  What time? 0 points  Count back from 20 0 points  Months in reverse 2 points  Repeat phrase 2 points  Total Score 4     Immunizations Immunization History  Administered Date(s) Administered  . PFIZER(Purple Top)SARS-COV-2 Vaccination 08/25/2019, 09/14/2019, 04/17/2020  . Tdap 12/13/2014    TDAP status: Up to date  Flu Vaccine status: Declined, Education has been provided regarding the importance of this vaccine but patient still declined. Advised may receive this vaccine at local pharmacy or Health Dept. Aware to provide a copy of the vaccination record if obtained from local pharmacy or Health Dept. Verbalized acceptance and understanding.  Pneumococcal vaccine status: Declined,  Education has been provided regarding the importance of this vaccine but patient still declined. Advised may receive this vaccine at local pharmacy or Health Dept. Aware to provide a copy of the vaccination record if obtained from local pharmacy or Health Dept. Verbalized acceptance and understanding.   Covid-19 vaccine status: Completed vaccines  Qualifies for Shingles Vaccine? Yes   Zostavax completed No   Shingrix Completed?: No.    Education has been provided regarding the importance of this vaccine. Patient has been advised to call insurance company to determine out of pocket expense if they have not yet received this vaccine. Advised may also receive vaccine at local pharmacy or Health Dept. Verbalized acceptance and understanding.  Screening Tests Health Maintenance  Topic Date Due  . COLONOSCOPY (Pts 45-45yrs Insurance coverage will need to be confirmed)  03/20/2020  . INFLUENZA VACCINE  11/05/2020 (Originally 01/30/2020)  . PNA vac Low Risk Adult (1 of 2 - PCV13) 09/19/2021 (Originally 05/31/2018)  . COVID-19 Vaccine (4 - Booster for Pfizer series) 10/16/2020  . TETANUS/TDAP  12/12/2024  . Hepatitis C Screening  Completed  . HPV VACCINES  Aged Out    Health Maintenance  Health Maintenance Due  Topic Date Due  . COLONOSCOPY (Pts 45-96yrs Insurance coverage will need to be confirmed)  03/20/2020    Colorectal  cancer screening: patient to schedule  Lung Cancer Screening: (Low Dose CT Chest recommended if Age 20-80 years, 30 pack-year currently smoking OR have quit w/in 15years.) does not qualify.   Lung Cancer Screening Referral: no  Additional Screening:  Hepatitis  C Screening: does qualify; Completed 04/21/2017  Vision Screening: Recommended annual ophthalmology exams for early detection of glaucoma and other disorders of the eye. Is the patient up to date with their annual eye exam?  Yes  Who is the provider or what is the name of the office in which the patient attends annual eye exams? [facility] If pt is not established with a provider, would they like to be referred to a provider to establish care? No .   Dental Screening: Recommended annual dental exams for proper oral hygiene  Community Resource Referral / Chronic Care Management: CRR required this visit?  No   CCM required this visit?  No      Plan:     I have personally reviewed and noted the following in the patient's chart:   . Medical and social history . Use of alcohol, tobacco or illicit drugs  . Current medications and supplements . Functional ability and status . Nutritional status . Physical activity . Advanced directives . List of other physicians . Hospitalizations, surgeries, and ER visits in previous 12 months . Vitals . Screenings to include cognitive, depression, and falls . Referrals and appointments  In addition, I have reviewed and discussed with patient certain preventive protocols, quality metrics, and best practice recommendations. A written personalized care plan for preventive services as well as general preventive health recommendations were provided to patient.     Kellie Simmering, LPN   [id number]   Nurse Notes:

## 2021-01-02 ENCOUNTER — Other Ambulatory Visit: Payer: Medicare HMO

## 2021-01-02 DIAGNOSIS — E7800 Pure hypercholesterolemia, unspecified: Secondary | ICD-10-CM

## 2021-01-02 DIAGNOSIS — R7303 Prediabetes: Secondary | ICD-10-CM | POA: Diagnosis not present

## 2021-01-02 DIAGNOSIS — I10 Essential (primary) hypertension: Secondary | ICD-10-CM | POA: Diagnosis not present

## 2021-01-02 DIAGNOSIS — R972 Elevated prostate specific antigen [PSA]: Secondary | ICD-10-CM

## 2021-01-02 DIAGNOSIS — E669 Obesity, unspecified: Secondary | ICD-10-CM | POA: Diagnosis not present

## 2021-01-02 DIAGNOSIS — Z0000 Encounter for general adult medical examination without abnormal findings: Secondary | ICD-10-CM | POA: Diagnosis not present

## 2021-01-03 LAB — CBC WITH DIFFERENTIAL/PLATELET
Absolute Monocytes: 617 cells/uL (ref 200–950)
Basophils Absolute: 41 cells/uL (ref 0–200)
Basophils Relative: 0.8 %
Eosinophils Absolute: 158 cells/uL (ref 15–500)
Eosinophils Relative: 3.1 %
HCT: 49 % (ref 38.5–50.0)
Hemoglobin: 16.7 g/dL (ref 13.2–17.1)
Lymphs Abs: 2336 cells/uL (ref 850–3900)
MCH: 32.4 pg (ref 27.0–33.0)
MCHC: 34.1 g/dL (ref 32.0–36.0)
MCV: 95 fL (ref 80.0–100.0)
MPV: 9.7 fL (ref 7.5–12.5)
Monocytes Relative: 12.1 %
Neutro Abs: 1948 cells/uL (ref 1500–7800)
Neutrophils Relative %: 38.2 %
Platelets: 199 10*3/uL (ref 140–400)
RBC: 5.16 10*6/uL (ref 4.20–5.80)
RDW: 11.8 % (ref 11.0–15.0)
Total Lymphocyte: 45.8 %
WBC: 5.1 10*3/uL (ref 3.8–10.8)

## 2021-01-03 LAB — COMPLETE METABOLIC PANEL WITH GFR
AG Ratio: 1.4 (calc) (ref 1.0–2.5)
ALT: 22 U/L (ref 9–46)
AST: 17 U/L (ref 10–35)
Albumin: 3.9 g/dL (ref 3.6–5.1)
Alkaline phosphatase (APISO): 62 U/L (ref 35–144)
BUN: 18 mg/dL (ref 7–25)
CO2: 28 mmol/L (ref 20–32)
Calcium: 9.3 mg/dL (ref 8.6–10.3)
Chloride: 104 mmol/L (ref 98–110)
Creat: 0.95 mg/dL (ref 0.70–1.25)
GFR, Est African American: 96 mL/min/{1.73_m2} (ref >=60)
GFR, Est Non African American: 82 mL/min/{1.73_m2} (ref >=60)
Globulin: 2.8 g/dL (calc) (ref 1.9–3.7)
Glucose, Bld: 104 mg/dL — ABNORMAL HIGH (ref 65–99)
Potassium: 4.3 mmol/L (ref 3.5–5.3)
Sodium: 140 mmol/L (ref 135–146)
Total Bilirubin: 0.7 mg/dL (ref 0.2–1.2)
Total Protein: 6.7 g/dL (ref 6.1–8.1)

## 2021-01-03 LAB — LIPID PANEL
Cholesterol: 195 mg/dL (ref <200)
HDL: 49 mg/dL (ref >=40)
LDL Cholesterol (Calc): 126 mg/dL (calc) — ABNORMAL HIGH
Non-HDL Cholesterol (Calc): 146 mg/dL (calc) — ABNORMAL HIGH (ref <130)
Total CHOL/HDL Ratio: 4 (calc) (ref <5.0)
Triglycerides: 98 mg/dL (ref <150)

## 2021-01-03 LAB — PSA: PSA: 1.7 ng/mL (ref <=4.00)

## 2021-01-03 LAB — HEMOGLOBIN A1C
Hgb A1c MFr Bld: 6 % of total Hgb — ABNORMAL HIGH (ref <5.7)
Mean Plasma Glucose: 126 mg/dL
eAG (mmol/L): 7 mmol/L

## 2021-01-03 LAB — TSH: TSH: 3.84 mIU/L (ref 0.40–4.50)

## 2021-01-09 ENCOUNTER — Other Ambulatory Visit: Payer: Self-pay | Admitting: Family Medicine

## 2021-01-09 ENCOUNTER — Other Ambulatory Visit: Payer: Self-pay

## 2021-01-09 ENCOUNTER — Ambulatory Visit (INDEPENDENT_AMBULATORY_CARE_PROVIDER_SITE_OTHER): Payer: Medicare HMO | Admitting: Family Medicine

## 2021-01-09 VITALS — BP 124/75 | HR 71 | Ht 72.0 in | Wt 238.0 lb

## 2021-01-09 DIAGNOSIS — E7800 Pure hypercholesterolemia, unspecified: Secondary | ICD-10-CM | POA: Diagnosis not present

## 2021-01-09 DIAGNOSIS — I10 Essential (primary) hypertension: Secondary | ICD-10-CM | POA: Diagnosis not present

## 2021-01-09 DIAGNOSIS — N528 Other male erectile dysfunction: Secondary | ICD-10-CM | POA: Diagnosis not present

## 2021-01-09 DIAGNOSIS — R7303 Prediabetes: Secondary | ICD-10-CM

## 2021-01-09 DIAGNOSIS — E669 Obesity, unspecified: Secondary | ICD-10-CM

## 2021-01-09 DIAGNOSIS — Z0000 Encounter for general adult medical examination without abnormal findings: Secondary | ICD-10-CM

## 2021-01-09 DIAGNOSIS — Z8619 Personal history of other infectious and parasitic diseases: Secondary | ICD-10-CM

## 2021-01-09 DIAGNOSIS — R972 Elevated prostate specific antigen [PSA]: Secondary | ICD-10-CM

## 2021-01-09 DIAGNOSIS — E66811 Obesity, class 1: Secondary | ICD-10-CM

## 2021-01-09 MED ORDER — SILDENAFIL CITRATE 20 MG PO TABS: ORAL_TABLET | ORAL | 11 refills | Status: DC

## 2021-01-09 NOTE — Assessment & Plan Note (Signed)
Mild elevated LDL, stable from prior result Last lipid panel 12/2020 The 10-year ASCVD risk score Zachary Bussing DC Jr., et al., 2013) is: 16.1%   Plan: 1. Reviewed ASCVD risk and offered statin therapy - patient declines again at this time, prefers to only take ASA for now and improve lifestyle, understands counseling on benefits and risk reduction of statin - - Today he will decline statin therapy may reconsider within next few year 2. Continue ASA 81mg  for primary ASCVD risk reduction 3. Encourage improved lifestyle - low carb/cholesterol, reduce portion size, continue improving regular exercise 4. Follow-up yearly lipids

## 2021-01-09 NOTE — Progress Notes (Signed)
Subjective:    Patient ID: Zachary Wall, male    DOB: 01-29-1951, 70 y.o.   MRN: [id number]  Zachary Wall is a 70 y.o. male presenting on 01/09/2021 for Annual Exam   HPI  Here for Annual Physical and Lab Review   Pre-Diabetes / OBESITY BMI >32 Last Lab A1c 6.0 prior 5.8 No new concerns today. He admits less active lately. Lifestyle: - Weight stable - Diet: stable diet, limiting carbs and sweets, drinks mostly water, 1-2 diet drinks daily - Exercise: not as active due to heat. Denies hypoglycemia, polyuria, visual changes, numbness or tingling.   CHRONIC HTN: Home BP normal readings Current Meds - Valsartan 80mg  daily Reports good compliance, took meds today. Tolerating well, w/o complaints. Lifestyle: (see below) Denies CP, dyspnea, HA, edema, dizziness / lightheadedness   Erectile Dysfunction Stable chronic problem. He takes Sildenafil up to 3 pills 20mg  each PRN with good results, request refill today   HYPERLIPIDEMIA: - Reports no concerns. Last lipid panel 12/2020, stable LDL cholesterol - Currently not on Statin. Not interested. - Taking ASA EC 81mg  daily since last visit, tolerating well - he does admit some mild easy bleeding, but doing fine - No known CVD or MI in family history     Health Maintenance:   UTD [place] 08/2019 and 08/2019, booster x 1, not received 2nd booster.   Prostate CA Screening: Previously followed by [facility] due to elevated PSA in past. Recent results were range of 1 to 2. Last reading PSA 1.70 (12/2020) previous 2.5 Currently asymptomatic without LUTS. No known family history of prostate CA   UTD colonoscopy - last 2016, per Dr Gaylyn Cheers - scanned into chart  DUE 5 year, now tubular adenoma, x 1 , was unable to get Colonoscopy scheduled 2021 - he will try again.  UTD Hep C and HIV routine screening, negative 03/2017  Due for - Shingrix. Unsure if he had Chicken Pox in past was told he did not have rash but was  exposed.  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   Past Surgical History:  Procedure Laterality Date   SKIN CANCER EXCISION     Social History   Socioeconomic History   Marital status: Married    Spouse name: Not on file   Number of children: Not on file   Years of education: Not on file   Highest education level: Not on file  Occupational History   Not on file  Tobacco Use   Smoking status: Never   Smokeless tobacco: Never  Substance and Sexual Activity   Alcohol use: No   Drug use: No   Sexual activity: Yes  Other Topics Concern   Not on file  Social History Narrative  Not on file   Social Determinants of Health   Financial Resource Strain: Low Risk    Difficulty of Paying Living Expenses: Not hard at all  Food Insecurity: No Food Insecurity   Worried About Charity fundraiser in the Last Year: Never true   [place] in the Last Year: Never true  Transportation Needs: No Transportation Needs   Lack of Transportation (Medical): No   Lack of Transportation (Non-Medical): No  Physical Activity: Sufficiently Active   Days of Exercise per Week: 7 days   Minutes of Exercise per Session: 30 min  Stress: No Stress Concern Present   Feeling of Stress : Not at all  Social Connections: Not on file  Intimate Partner Violence: Not on file   Family History  Problem Relation Age of Onset   Hematuria Neg Hx     Prostate cancer Neg Hx    Tuberculosis Neg Hx    Kidney cancer Neg Hx    Bladder Cancer Neg Hx    Current Outpatient Medications on File Prior to Visit  Medication Sig   acetaminophen (TYLENOL) 500 MG chewable tablet Chew 500 mg by mouth every 6 (six) hours as needed for pain.   aspirin EC 81 MG tablet Take 1 tablet (81 mg total) daily by mouth.   omeprazole (PRILOSEC) 20 MG capsule TAKE 1 CAPSULE (20 MG TOTAL) BY MOUTH DAILY BEFORE BREAKFAST.   valsartan (DIOVAN) 80 MG tablet TAKE 1 TABLET BY MOUTH EVERY DAY   No current facility-administered medications on file prior to visit.    Review of Systems  Constitutional:  Negative for activity change, appetite change, chills, diaphoresis, fatigue and fever.  HENT:  Negative for congestion and hearing loss.   Eyes:  Negative for visual disturbance.  Respiratory:  Negative for cough, chest tightness, shortness of breath and wheezing.   Cardiovascular:  Negative for chest pain, palpitations and leg swelling.  Gastrointestinal:  Negative for abdominal pain, constipation, diarrhea, nausea and vomiting.  Genitourinary:  Negative for dysuria, frequency and hematuria.  Musculoskeletal:  Negative for arthralgias and neck pain.  Skin:  Negative for rash.  Neurological:  Negative for dizziness, weakness, light-headedness, numbness and headaches.  Hematological:  Negative for adenopathy.  Psychiatric/Behavioral:  Negative for behavioral problems, dysphoric mood and sleep disturbance.   Per HPI unless specifically indicated above      Objective:    BP 124/75   Pulse 71   Ht 6' (1.829 m)   Wt 238 lb (108 kg)   SpO2 100%   BMI 32.28 kg/m   Wt Readings from Last 3 Encounters:  01/09/21 238 lb (108 kg)  09/19/20 230 lb (104.3 kg)  01/07/20 239 lb 9.6 oz (108.7 kg)    Physical Exam Vitals and nursing note reviewed.  Constitutional:      General: He is not in acute distress.    Appearance: He is well-developed. He is not diaphoretic.      Comments: Well-appearing, comfortable, cooperative  HENT:     Head: Normocephalic and atraumatic.  Eyes:     General:        Right eye: No discharge.        Left eye: No discharge.     Conjunctiva/sclera: Conjunctivae normal.     Pupils: Pupils are equal, round, and reactive to light.  Neck:     Thyroid: No thyromegaly.  Cardiovascular:     Rate and Rhythm: Normal rate and regular rhythm.     Pulses: Normal  pulses.     Heart sounds: Normal heart sounds. No murmur heard. Pulmonary:     Effort: Pulmonary effort is normal. No respiratory distress.     Breath sounds: Normal breath sounds. No wheezing or rales.  Abdominal:     General: Bowel sounds are normal. There is no distension.     Palpations: Abdomen is soft. There is no mass.     Tenderness: There is no abdominal tenderness.  Musculoskeletal:        General: No tenderness. Normal range of motion.     Cervical back: Normal range of motion and neck supple.     Comments: Upper / Lower Extremities: - Normal muscle tone, strength bilateral upper extremities 5/5, lower extremities 5/5  Lymphadenopathy:     Cervical: No cervical adenopathy.  Skin:    General: Skin is warm and dry.     Findings: No erythema or rash.  Neurological:     Mental Status: He is alert and oriented to person, place, and time.     Comments: Distal sensation intact to light touch all extremities  Psychiatric:        Mood and Affect: Mood normal.        Behavior: Behavior normal.        Thought Content: Thought content normal.     Comments: Well groomed, good eye contact, normal speech and thoughts     Results for orders placed or performed in visit on 01/02/21  TSH  Result Value Ref Range   TSH 3.84 0.40 - 4.50 mIU/L  PSA  Result Value Ref Range   PSA 1.70 < OR = 4.00 ng/mL  Lipid panel  Result Value Ref Range   Cholesterol 195 <200 mg/dL   HDL 49 > OR = 40 mg/dL   Triglycerides 98 <150 mg/dL   LDL Cholesterol (Calc) 126 (H) mg/dL (calc)    Total CHOL/HDL Ratio 4.0 <5.0 (calc)   Non-HDL Cholesterol (Calc) 146 (H) <130 mg/dL (calc)  COMPLETE METABOLIC PANEL WITH GFR  Result Value Ref Range   Glucose, Bld 104 (H) 65 - 99 mg/dL   BUN 18 7 - 25 mg/dL   Creat 0.95 0.70 - 1.25 mg/dL   GFR, Est Non African American 82 > OR = 60 mL/min/1.14m2   GFR, Est African American 96 > OR = 60 mL/min/1.53m2   BUN/Creatinine Ratio NOT APPLICABLE 6 - 22 (calc)   Sodium 140 135 - 146 mmol/L   Potassium 4.3 3.5 - 5.3 mmol/L   Chloride 104 98 - 110 mmol/L   CO2 28 20 - 32 mmol/L   Calcium 9.3 8.6 - 10.3 mg/dL   Total Protein 6.7 6.1 - 8.1 g/dL   Albumin 3.9 3.6 - 5.1 g/dL   Globulin 2.8 1.9 - 3.7 g/dL (calc)   AG Ratio 1.4 1.0 - 2.5 (calc)   Total Bilirubin 0.7 0.2 - 1.2 mg/dL   Alkaline phosphatase (APISO) 62 35 - 144 U/L   AST 17 10 - 35 U/L   ALT 22 9 - 46 U/L  CBC with Differential/Platelet  Result Value Ref Range   WBC 5.1 3.8 - 10.8 Thousand/uL   RBC 5.16 4.20 - 5.80 Million/uL   Hemoglobin 16.7 13.2 - 17.1 g/dL   HCT 49.0 38.5 - 50.0 %   MCV 95.0 80.0 - 100.0 fL   MCH 32.4 27.0 - 33.0 pg   MCHC 34.1 32.0 - 36.0 g/dL   RDW 11.8 11.0 - 15.0 %   Platelets 199 140 -  400 Thousand/uL   MPV 9.7 7.5 - 12.5 fL   Neutro Abs 1,948 1,500 - 7,800 cells/uL   Lymphs Abs 2,336 850 - 3,900 cells/uL   Absolute Monocytes 617 200 - 950 cells/uL   Eosinophils Absolute 158 15 - 500 cells/uL   Basophils Absolute 41 0 - 200 cells/uL   Neutrophils Relative % 38.2 %   Total Lymphocyte 45.8 %   Monocytes Relative 12.1 %   Eosinophils Relative 3.1 %   Basophils Relative 0.8 %  Hemoglobin A1c  Result Value Ref Range   Hgb A1c MFr Bld 6.0 (H) <5.7 % of total Hgb   Mean Plasma Glucose 126 mg/dL   eAG (mmol/L) 7.0 mmol/L      Assessment & Plan:   Problem List Items Addressed This Visit     Pre-diabetes    Mild elevated A1c up to 6.0 With HLD and HTN, fairly well controlled overall Wt stable  Plan:  1. Not on any therapy currently  2.  Encourage improved lifestyle - low carb, low sugar diet, reduce portion size, continue improving regular exercise       Other male erectile dysfunction    Controlled on PDE5 Refill Sildenafil, goodrx       Relevant Medications   sildenafil (REVATIO) 20 MG tablet   Obesity (BMI 30.0-34.9)   Hyperlipidemia    Mild elevated LDL, stable from prior result Last lipid panel 12/2020 The 10-year ASCVD risk score Mikey Bussing DC Jr., et al., 2013) is: 16.1%   Plan: 1. Reviewed ASCVD risk and offered statin therapy - patient declines again at this time, prefers to only take ASA for now and improve lifestyle, understands counseling on benefits and risk reduction of statin - - Today he will decline statin therapy may reconsider within next few year 2. Continue ASA 81mg  for primary ASCVD risk reduction 3. Encourage improved lifestyle - low carb/cholesterol, reduce portion size, continue improving regular exercise 4. Follow-up yearly lipids       Relevant Medications   sildenafil (REVATIO) 20 MG tablet   Essential hypertension    Controlled BP Home reviewed No known complications  Failed Lisinopril (ACEi cough)    Plan:  1. Continue Valsartan 80mg  daily 2. Encourage improved lifestyle - low sodium diet, regular exercise 3. Continue monitor BP outside office, bring readings to next visit, if persistently >140/90 or new symptoms notify office sooner       Relevant Medications   sildenafil (REVATIO) 20 MG tablet   Elevated PSA    Low risk patient, with prior reported negative screening - PSA 1 to 2 in past, has seen Urology [facility] - Last PSA improved to 1.4 from 2.5 - Last DRE 30g prostate w/o abnormality, 05/2016 per Urology - Clinically asymptomatic   Plan: 1. Reviewed prostate cancer screening guidelines and risks including potential prostate biopsy if abnormal PSA - may need to return to Urology if PSA actually does trend up - Proceed with yearly PSA for now, and anticipate DRE as needed  especially if abnormal PSA or new symptoms  Does not need to return to [facility] for prostate monitoring       Other Visit Diagnoses     Annual physical exam    -  Primary      Updated Health Maintenance information Reviewed recent lab results with patient Encouraged improvement to lifestyle with diet and exercise Goal of weight loss    Meds ordered this encounter  Medications   sildenafil (REVATIO) 20 MG tablet  Sig: Take 3-5 pills about 30 min prior to sex    Dispense:  30 tablet    Refill:  11    goodrx      Follow up plan: Return in about 1 year (around 01/09/2022) for 1 year fasting lab only then 1 week later Annual Physical.  Future labs 12/2021 add chicken pox virus titer VZV IgG  Nobie Putnam, DO [facility] 01/09/2021, 2:27 PM

## 2021-01-09 NOTE — Assessment & Plan Note (Signed)
Mild elevated A1c up to 6.0 With HLD and HTN, fairly well controlled overall Wt stable  Plan:  1. Not on any therapy currently  2. Encourage improved lifestyle - low carb, low sugar diet, reduce portion size, continue improving regular exercise

## 2021-01-09 NOTE — Patient Instructions (Addendum)
Thank you for coming to the office today.  Call GI when you get back from vacation. Let me know if you need a new referral.  If want to consider Shingrix vaccine we will check chicken pox lab immunity lvl next year.  Recent Labs    01/02/21 0814  HGBA1C 6.0*    DUE for FASTING BLOOD WORK (no food or drink after midnight before the lab appointment, only water or coffee without cream/sugar on the morning of)  SCHEDULE "Lab Only" visit in the morning at the clinic for lab draw in 1 YEAR  - Make sure Lab Only appointment is at about 1 week before your next appointment, so that results will be available  For Lab Results, once available within 2-3 days of blood draw, you can can log in to MyChart online to view your results and a brief explanation. Also, we can discuss results at next follow-up visit.   Please schedule a Follow-up Appointment to: Return in about 1 year (around 01/09/2022) for 1 year fasting lab only then 1 week later Annual Physical.  If you have any other questions or concerns, please feel free to call the office or send a message through [place]. You may also schedule an earlier appointment if necessary.  Additionally, you may be receiving a survey about your experience at our office within a few days to 1 week by e-mail or mail. We value your feedback.  Nobie Putnam, DO [place]

## 2021-01-09 NOTE — Assessment & Plan Note (Signed)
Low risk patient, with prior reported negative screening - PSA 1 to 2 in past, has seen Urology [facility] - Last PSA improved to 1.4 from 2.5 - Last DRE 30g prostate w/o abnormality, 05/2016 per Urology - Clinically asymptomatic  Plan: 1. Reviewed prostate cancer screening guidelines and risks including potential prostate biopsy if abnormal PSA - may need to return to Urology if PSA actually does trend up - Proceed with yearly PSA for now, and anticipate DRE as needed especially if abnormal PSA or new symptoms  Does not need to return to [facility] for prostate monitoring

## 2021-01-09 NOTE — Assessment & Plan Note (Signed)
Controlled BP Home reviewed No known complications  Failed Lisinopril (ACEi cough)    Plan:  1. Continue Valsartan 80mg daily 2. Encourage improved lifestyle - low sodium diet, regular exercise 3. Continue monitor BP outside office, bring readings to next visit, if persistently >140/90 or new symptoms notify office sooner 

## 2021-01-09 NOTE — Assessment & Plan Note (Signed)
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 

## 2021-02-05 ENCOUNTER — Other Ambulatory Visit: Payer: Self-pay

## 2021-02-05 ENCOUNTER — Ambulatory Visit: Payer: Medicare HMO | Admitting: Dermatology

## 2021-02-05 DIAGNOSIS — C44622 Squamous cell carcinoma of skin of right upper limb, including shoulder: Secondary | ICD-10-CM

## 2021-02-05 DIAGNOSIS — C4492 Squamous cell carcinoma of skin, unspecified: Secondary | ICD-10-CM

## 2021-02-05 DIAGNOSIS — L578 Other skin changes due to chronic exposure to nonionizing radiation: Secondary | ICD-10-CM | POA: Diagnosis not present

## 2021-02-05 DIAGNOSIS — D485 Neoplasm of uncertain behavior of skin: Secondary | ICD-10-CM

## 2021-02-05 HISTORY — DX: Squamous cell carcinoma of skin, unspecified: C44.92

## 2021-02-05 NOTE — Patient Instructions (Addendum)
If you have any questions or concerns for your doctor, please call our main line at [phone] and press option 4 to reach your doctor's medical assistant. If no one answers, please leave a voicemail as directed and we will return your call as soon as possible. Messages left after 4 pm will be answered the following business day.   You may also send [place] a message via [place]. We typically respond to MyChart messages within 1-2 business days.  For prescription refills, please ask your pharmacy to contact our office. Our fax number is [phone].  If you have an urgent issue when the clinic is closed that cannot wait until the next business day, you can page your doctor at the number below.    Please note that while we do our best to be available for urgent issues outside of office hours, we are not available 24/7.   If you have an urgent issue and are unable to reach [place], you may choose to seek medical care at your doctor's office, retail clinic, urgent care center, or emergency room.  If you have a medical emergency, please immediately call 911 or go to the emergency department.  Pager Numbers  - Dr. Nehemiah Massed: [phone]  - Dr. Laurence Ferrari: [phone]  - Dr. Nicole Kindred: [phone]  In the event of inclement weather, please call our main line at [phone] for an update on the status of any delays or closures.  Dermatology Medication Tips: Please keep the boxes that topical medications come in in order to help keep track of the instructions about where and how to use these. Pharmacies typically print the medication instructions only on the boxes and not directly on the medication tubes.   If your medication is too expensive, please contact our office at [phone] option 4 or send [place] a message through [place].   We are unable to tell what your co-pay for medications will be in advance as this is different depending on your insurance coverage. However, we may be able to find a substitute  medication at lower cost or fill out paperwork to get insurance to cover a needed medication.   If a prior authorization is required to get your medication covered by your insurance company, please allow [place] 1-2 business days to complete this process.  Drug prices often vary depending on where the prescription is filled and some pharmacies may offer cheaper prices.  The website www.goodrx.com contains coupons for medications through different pharmacies. The prices here do not account for what the cost may be with help from insurance (it may be cheaper with your insurance), but the website can give you the price if you did not use any insurance.  - You can print the associated coupon and take it with your prescription to the pharmacy.  - You may also stop by our office during regular business hours and pick up a GoodRx coupon card.  - If you need your prescription sent electronically to a different pharmacy, notify our office through [facility] or by phone at [phone] option 4. Electrodesiccation and Curettage ("Scrape and Burn") Wound Care Instructions  Leave the original bandage on for 24 hours if possible.  If the bandage becomes soaked or soiled before that time, it is OK to remove it and examine the wound.  A small amount of post-operative bleeding is normal.  If excessive bleeding occurs, remove the bandage, place gauze over the site and apply continuous pressure (no peeking) over the area for 30 minutes. If  this does not work, please call our clinic as soon as possible or page your doctor if it is after hours.   Once a day, cleanse the wound with soap and water. It is fine to shower. If a thick crust develops you may use a Q-tip dipped into dilute hydrogen peroxide (mix 1:1 with water) to dissolve it.  Hydrogen peroxide can slow the healing process, so use it only as needed.    After washing, apply petroleum jelly (Vaseline) or an antibiotic ointment if your doctor prescribed one  for you, followed by a bandage.    For best healing, the wound should be covered with a layer of ointment at all times. If you are not able to keep the area covered with a bandage to hold the ointment in place, this may mean re-applying the ointment several times a day.  Continue this wound care until the wound has healed and is no longer open. It may take several weeks for the wound to heal and close.  Itching and mild discomfort is normal during the healing process.  If you have any discomfort, you can take Tylenol (acetaminophen) or ibuprofen as directed on the bottle. (Please do not take these if you have an allergy to them or cannot take them for another reason).  Some redness, tenderness and white or yellow material in the wound is normal healing.  If the area becomes very sore and red, or develops a thick yellow-green material (pus), it may be infected; please notify us.    Wound healing continues for up to one year following surgery. It is not unusual to experience pain in the scar from time to time during the interval.  If the pain becomes severe or the scar thickens, you should notify the office.    A slight amount of redness in a scar is expected for the first six months.  After six months, the redness will fade and the scar will soften and fade.  The color difference becomes less noticeable with time.  If there are any problems, return for a post-op surgery check at your earliest convenience.  To improve the appearance of the scar, you can use silicone scar gel, cream, or sheets (such as Mederma or Serica) every night for up to one year. These are available over the counter (without a prescription).  Please call our office at [phone] for any questions or concerns.

## 2021-02-05 NOTE — Progress Notes (Signed)
   Follow-Up Visit   Subjective  Zachary Wall is a 70 y.o. male who presents for the following: new skin lesion (On the right forearm - has been present for a few weeks now and will not resolve).  The following portions of the chart were reviewed this encounter and updated as appropriate:   Tobacco  Allergies  Meds  Problems  Med Hx  Surg Hx  Fam Hx     Review of Systems:  No other skin or systemic complaints except as noted in HPI or Assessment and Plan.  Objective  Well appearing patient in no apparent distress; mood and affect are within normal limits.  A focused examination was performed including the right forearm. Relevant physical exam findings are noted in the Assessment and Plan.  R volar forearm 1.7 cm hyperkeratotic papule.   Assessment & Plan  Neoplasm of uncertain behavior of skin R volar forearm  Epidermal / dermal shaving  Lesion diameter (cm):  1.7 Informed consent: discussed and consent obtained   Timeout: patient name, date of birth, surgical site, and procedure verified   Procedure prep:  Patient was prepped and draped in usual sterile fashion Prep type:  Isopropyl alcohol Anesthesia: the lesion was anesthetized in a standard fashion   Anesthetic:  1% lidocaine w/ epinephrine 1-100,000 buffered w/ 8.4% NaHCO3 Instrument used: flexible razor blade   Hemostasis achieved with: pressure, aluminum chloride and electrodesiccation   Outcome: patient tolerated procedure well   Post-procedure details: sterile dressing applied and wound care instructions given   Dressing type: bandage and petrolatum    Destruction of lesion Complexity: extensive   Destruction method: electrodesiccation and curettage   Informed consent: discussed and consent obtained   Timeout:  patient name, date of birth, surgical site, and procedure verified Procedure prep:  Patient was prepped and draped in usual sterile fashion Prep type:  Isopropyl alcohol Anesthesia: the lesion was  anesthetized in a standard fashion   Anesthetic:  1% lidocaine w/ epinephrine 1-100,000 buffered w/ 8.4% NaHCO3 Curettage performed in three different directions: Yes   Electrodesiccation performed over the curetted area: Yes   Lesion length (cm):  1.7 Lesion width (cm):  1.7 Margin per side (cm):  0.2 Final wound size (cm):  2.1 Hemostasis achieved with:  pressure, aluminum chloride and electrodesiccation Outcome: patient tolerated procedure well with no complications   Post-procedure details: sterile dressing applied and wound care instructions given   Dressing type: bandage and petrolatum    Specimen 1 - Surgical pathology Differential Diagnosis: D48.5 r/o SCC ED&C today  Check Margins: No  Actinic Damage - chronic, secondary to cumulative UV radiation exposure/sun exposure over time - diffuse scaly erythematous macules with underlying dyspigmentation - Recommend daily broad spectrum sunscreen SPF 30+ to sun-exposed areas, reapply every 2 hours as needed.  - Recommend staying in the shade or wearing long sleeves, sun glasses (UVA+UVB protection) and wide brim hats (4-inch brim around the entire circumference of the hat). - Call for new or changing lesions.  Return for appointment as scheduled.  Luther Redo, CMA, am acting as scribe for Sarina Ser, MD .  Documentation: I have reviewed the above documentation for accuracy and completeness, and I agree with the above.  Sarina Ser, MD

## 2021-02-06 ENCOUNTER — Encounter: Payer: Self-pay | Admitting: Dermatology

## 2021-02-08 ENCOUNTER — Other Ambulatory Visit: Payer: Self-pay | Admitting: Family Medicine

## 2021-02-08 DIAGNOSIS — K219 Gastro-esophageal reflux disease without esophagitis: Secondary | ICD-10-CM

## 2021-02-14 ENCOUNTER — Telehealth: Payer: Self-pay

## 2021-02-14 NOTE — Telephone Encounter (Signed)
-----   Message from Ralene Bathe, MD sent at 02/12/2021  5:36 PM EDT ----- Diagnosis Skin (M), right volar forearm WELL DIFFERENTIATED SQUAMOUS CELL CARCINOMA, BASE INVOLVED  Cancer - Scc Already treated Recheck next visit

## 2021-02-14 NOTE — Telephone Encounter (Signed)
Left message on voicemail to return my call.  

## 2021-02-14 NOTE — Telephone Encounter (Signed)
Left message for pt to call for bx results/sh

## 2021-02-15 ENCOUNTER — Telehealth: Payer: Self-pay

## 2021-02-15 NOTE — Telephone Encounter (Signed)
-----   Message from Ralene Bathe, MD sent at 02/12/2021  5:36 PM EDT ----- Diagnosis Skin (M), right volar forearm WELL DIFFERENTIATED SQUAMOUS CELL CARCINOMA, BASE INVOLVED  Cancer - Scc Already treated Recheck next visit

## 2021-02-15 NOTE — Telephone Encounter (Signed)
Patient advised of BX result.

## 2021-02-19 ENCOUNTER — Other Ambulatory Visit: Payer: Self-pay | Admitting: Family Medicine

## 2021-02-19 DIAGNOSIS — I10 Essential (primary) hypertension: Secondary | ICD-10-CM

## 2021-02-19 NOTE — Telephone Encounter (Signed)
Requested Prescriptions  Pending Prescriptions Disp Refills  . valsartan (DIOVAN) 80 MG tablet [Pharmacy Med Name: VALSARTAN 80 MG TABLET] 90 tablet 1    Sig: TAKE 1 TABLET BY MOUTH EVERY DAY     Cardiovascular:  Angiotensin Receptor Blockers Passed - 02/19/2021  1:56 PM      Passed - Cr in normal range and within 180 days    Creat  Date Value Ref Range Status  01/02/2021 0.95 0.70 - 1.25 mg/dL Final    Comment:    For patients >68 years of age, the reference limit for Creatinine is approximately 13% higher for people identified as African-American. .          Passed - K in normal range and within 180 days    Potassium  Date Value Ref Range Status  01/02/2021 4.3 3.5 - 5.3 mmol/L Final         Passed - Patient is not pregnant      Passed - Last BP in normal range    BP Readings from Last 1 Encounters:  01/09/21 124/75         Passed - Valid encounter within last 6 months    Recent Outpatient Visits          1 month ago Annual physical exam   [place], DO   1 year ago Annual physical exam   [place], DO   1 year ago Acute left-sided low back pain with left-sided sciatica   [place], DO   1 year ago Acute left-sided low back pain with right-sided sciatica   [place], DO   1 year ago Close exposure to COVID-19 virus   [place], Wendee Beavers, [place]      Future Appointments            In 7 months [facility], [facility]

## 2021-04-13 ENCOUNTER — Telehealth: Payer: Self-pay

## 2021-04-13 NOTE — Telephone Encounter (Signed)
Pt. Calling to reschedule colonoscopy

## 2021-04-16 NOTE — Telephone Encounter (Signed)
Patient had cancelled procedure back during Covid. Informed patient we will need a new referral sent in. He will have PCP office to send over a new referral.

## 2021-04-17 ENCOUNTER — Telehealth: Payer: Self-pay

## 2021-04-17 DIAGNOSIS — D126 Benign neoplasm of colon, unspecified: Secondary | ICD-10-CM

## 2021-04-17 NOTE — Telephone Encounter (Signed)
Copied from [place] [phone]. Topic: Referral - Request for Referral >> Apr 16, 2021  4:12 PM Celene Kras wrote: Has patient seen PCP for this complaint? Yes.   *If NO, is insurance requiring patient see PCP for this issue before PCP can refer them? Referral for which specialty: Gastroenterology Preferred provider/office: [facility] Reason for referral: Pt states that he is needing to have a colonoscopy. Pt states that the previous referral expired. Please advise.

## 2021-04-19 ENCOUNTER — Telehealth: Payer: Self-pay

## 2021-04-19 NOTE — Telephone Encounter (Signed)
Pt. Ready to schedule colonoscopy 

## 2021-04-20 ENCOUNTER — Telehealth: Payer: Self-pay

## 2021-04-20 ENCOUNTER — Other Ambulatory Visit: Payer: Self-pay

## 2021-04-20 DIAGNOSIS — Z86010 Personal history of colonic polyps: Secondary | ICD-10-CM

## 2021-04-20 MED ORDER — PEG 3350-KCL-NA BICARB-NACL 420 G PO SOLR: 4000.0000 mL | Freq: Once | ORAL | 0 refills | Status: AC

## 2021-04-20 NOTE — Telephone Encounter (Signed)
Patient is ready to schedule procedure. Clinical staff will follow up with patient. °

## 2021-04-20 NOTE — Telephone Encounter (Signed)
Procedure scheduled for 05/03/21.

## 2021-04-20 NOTE — Telephone Encounter (Signed)
Call [phone]

## 2021-04-20 NOTE — Progress Notes (Signed)
Gastroenterology Pre-Procedure Review  Request Date: 05/03/21 Requesting Physician: Dr. Vicente Males  PATIENT REVIEW QUESTIONS: The patient responded to the following health history questions as indicated:    1. Are you having any GI issues? no 2. Do you have a personal history of Polyps? yes (03/21/2015. Polyps removed.) 3. Do you have a family history of Colon Cancer or Polyps? yes (pt states his children may have had polyps.) 4. Diabetes Mellitus? no 5. Joint replacements in the past 12 months?no 6. Major health problems in the past 3 months?no 7. Any artificial heart valves, MVP, or defibrillator?no    MEDICATIONS & ALLERGIES:    Patient reports the following regarding taking any anticoagulation/antiplatelet therapy:   Plavix, Coumadin, Eliquis, Xarelto, Lovenox, Pradaxa, Brilinta, or Effient? no Aspirin? yes (81 mg)  Patient confirms/reports the following medications:  Current Outpatient Medications  Medication Sig Dispense Refill   acetaminophen (TYLENOL) 500 MG chewable tablet Chew 500 mg by mouth every 6 (six) hours as needed for pain.     aspirin EC 81 MG tablet Take 1 tablet (81 mg total) daily by mouth.     omeprazole (PRILOSEC) 20 MG capsule TAKE 1 CAPSULE BY MOUTH DAILY BEFORE BREAKFAST. 90 capsule 1   sildenafil (REVATIO) 20 MG tablet Take 3-5 pills about 30 min prior to sex 30 tablet 11   valsartan (DIOVAN) 80 MG tablet TAKE 1 TABLET BY MOUTH EVERY DAY 90 tablet 1   No current facility-administered medications for this visit.    Patient confirms/reports the following allergies:  Allergies  Allergen Reactions   Ace Inhibitors Cough   Azithromycin Rash    No orders of the defined types were placed in this encounter.   AUTHORIZATION INFORMATION Primary Insurance: 1D#: Group #:  Secondary Insurance: 1D#: Group #:  SCHEDULE INFORMATION: Date: 05/03/21 Time: Location: [place]

## 2021-05-02 ENCOUNTER — Encounter: Payer: Self-pay | Admitting: Dermatology

## 2021-05-02 ENCOUNTER — Other Ambulatory Visit: Payer: Self-pay

## 2021-05-02 ENCOUNTER — Ambulatory Visit: Payer: Medicare HMO | Admitting: Dermatology

## 2021-05-02 DIAGNOSIS — L820 Inflamed seborrheic keratosis: Secondary | ICD-10-CM

## 2021-05-02 DIAGNOSIS — L578 Other skin changes due to chronic exposure to nonionizing radiation: Secondary | ICD-10-CM

## 2021-05-02 DIAGNOSIS — C44722 Squamous cell carcinoma of skin of right lower limb, including hip: Secondary | ICD-10-CM

## 2021-05-02 DIAGNOSIS — D492 Neoplasm of unspecified behavior of bone, soft tissue, and skin: Secondary | ICD-10-CM

## 2021-05-02 NOTE — Patient Instructions (Addendum)
If you have any questions or concerns for your doctor, please call our main line at [phone] and press option 4 to reach your doctor's medical assistant. If no one answers, please leave a voicemail as directed and we will return your call as soon as possible. Messages left after 4 pm will be answered the following business day.   You may also send us a message via MyChart. We typically respond to MyChart messages within 1-2 business days.  For prescription refills, please ask your pharmacy to contact our office. Our fax number is [phone].  If you have an urgent issue when the clinic is closed that cannot wait until the next business day, you can page your doctor at the number below.    Please note that while we do our best to be available for urgent issues outside of office hours, we are not available 24/7.   If you have an urgent issue and are unable to reach us, you may choose to seek medical care at your doctor's office, retail clinic, urgent care center, or emergency room.  If you have a medical emergency, please immediately call 911 or go to the emergency department.  Pager Numbers  - Dr. Kowalski: [phone]  - Dr. Moye: [phone]  - Dr. Stewart: [phone]  In the event of inclement weather, please call our main line at [phone] for an update on the status of any delays or closures.  Dermatology Medication Tips: Please keep the boxes that topical medications come in in order to help keep track of the instructions about where and how to use these. Pharmacies typically print the medication instructions only on the boxes and not directly on the medication tubes.   If your medication is too expensive, please contact our office at [phone] option 4 or send us a message through MyChart.   We are unable to tell what your co-pay for medications will be in advance as this is different depending on your insurance coverage. However, we may be able to find a substitute  medication at lower cost or fill out paperwork to get insurance to cover a needed medication.   If a prior authorization is required to get your medication covered by your insurance company, please allow us 1-2 business days to complete this process.  Drug prices often vary depending on where the prescription is filled and some pharmacies may offer cheaper prices.  The website www.goodrx.com contains coupons for medications through different pharmacies. The prices here do not account for what the cost may be with help from insurance (it may be cheaper with your insurance), but the website can give you the price if you did not use any insurance.  - You can print the associated coupon and take it with your prescription to the pharmacy.  - You may also stop by our office during regular business hours and pick up a GoodRx coupon card.  - If you need your prescription sent electronically to a different pharmacy, notify our office through [facility] MyChart or by phone at [phone] option 4.   Wound Care Instructions  Cleanse wound gently with soap and water once a day then pat dry with clean gauze. Apply a thing coat of Petrolatum (petroleum jelly, "Vaseline") over the wound (unless you have an allergy to this). We recommend that you use a new, sterile tube of Vaseline. Do not pick or remove scabs. Do not remove the yellow or white "healing tissue" from the base of the wound.  Cover the   wound with fresh, clean, nonstick gauze and secure with paper tape. You may use Band-Aids in place of gauze and tape if the would is small enough, but would recommend trimming much of the tape off as there is often too much. Sometimes Band-Aids can irritate the skin.  You should call the office for your biopsy report after 1 week if you have not already been contacted.  If you experience any problems, such as abnormal amounts of bleeding, swelling, significant bruising, significant pain, or evidence of infection,  please call the office immediately.  FOR ADULT SURGERY PATIENTS: If you need something for pain relief you may take 1 extra strength Tylenol (acetaminophen) AND 2 Ibuprofen (200mg each) together every 4 hours as needed for pain. (do not take these if you are allergic to them or if you have a reason you should not take them.) Typically, you may only need pain medication for 1 to 3 days.    

## 2021-05-02 NOTE — Progress Notes (Signed)
Follow-Up Visit   Subjective  Zachary Wall is a 70 y.o. male who presents for the following: check spot (R calf, 2 wks, tender to touch, burns).  He has other itchy rough areas on his legs.  The following portions of the chart were reviewed this encounter and updated as appropriate:   Tobacco  Allergies  Meds  Problems  Med Hx  Surg Hx  Fam Hx     Review of Systems:  No other skin or systemic complaints except as noted in HPI or Assessment and Plan.  Objective  Well appearing patient in no apparent distress; mood and affect are within normal limits.  A focused examination was performed including right leg. Relevant physical exam findings are noted in the Assessment and Plan.  R distal calf 1.2cm keratotic pap     bil lower legs x 8, Total = 8 Erythematous keratotic or waxy stuck-on papule or plaque.    Assessment & Plan  Neoplasm of skin R distal calf  Epidermal / dermal shaving  Lesion diameter (cm):  1.2 Informed consent: discussed and consent obtained   Timeout: patient name, date of birth, surgical site, and procedure verified   Procedure prep:  Patient was prepped and draped in usual sterile fashion Prep type:  Isopropyl alcohol Anesthesia: the lesion was anesthetized in a standard fashion   Anesthetic:  1% lidocaine w/ epinephrine 1-100,000 buffered w/ 8.4% NaHCO3 Instrument used: flexible razor blade   Hemostasis achieved with: pressure, aluminum chloride and electrodesiccation   Outcome: patient tolerated procedure well   Post-procedure details: sterile dressing applied and wound care instructions given   Dressing type: bandage and bacitracin    Destruction of lesion Complexity: extensive   Destruction method: electrodesiccation and curettage   Informed consent: discussed and consent obtained   Timeout:  patient name, date of birth, surgical site, and procedure verified Procedure prep:  Patient was prepped and draped in usual sterile fashion Prep  type:  Isopropyl alcohol Anesthesia: the lesion was anesthetized in a standard fashion   Anesthetic:  1% lidocaine w/ epinephrine 1-100,000 buffered w/ 8.4% NaHCO3 Curettage performed in three different directions: Yes   Electrodesiccation performed over the curetted area: Yes   Lesion length (cm):  1.2 Lesion width (cm):  1.2 Margin per side (cm):  0.2 Final wound size (cm):  1.6 Hemostasis achieved with:  pressure, aluminum chloride and electrodesiccation Outcome: patient tolerated procedure well with no complications   Post-procedure details: sterile dressing applied and wound care instructions given   Dressing type: bandage and petrolatum    Specimen 1 - Surgical pathology Differential Diagnosis: D48.5 R/O SCC  Check Margins: No 1.2cm keratotic pap EDC today  Inflamed seborrheic keratosis bil lower legs x 8, Total = 8  Destruction of lesion - bil lower legs x 8, Total = 8 Complexity: simple   Destruction method: cryotherapy   Informed consent: discussed and consent obtained   Timeout:  patient name, date of birth, surgical site, and procedure verified Lesion destroyed using liquid nitrogen: Yes   Region frozen until ice ball extended beyond lesion: Yes   Outcome: patient tolerated procedure well with no complications   Post-procedure details: wound care instructions given    Actinic Damage - chronic, secondary to cumulative UV radiation exposure/sun exposure over time - diffuse scaly erythematous macules with underlying dyspigmentation - Recommend daily broad spectrum sunscreen SPF 30+ to sun-exposed areas, reapply every 2 hours as needed.  - Recommend staying in the shade or wearing long  sleeves, sun glasses (UVA+UVB protection) and wide brim hats (4-inch brim around the entire circumference of the hat). - Call for new or changing lesions.  Return for as scheduled for [facility].  I, [name], RMA, am acting as scribe for Sarina Ser, MD . Documentation: I have  reviewed the above documentation for accuracy and completeness, and I agree with the above.  Sarina Ser, MD

## 2021-05-03 ENCOUNTER — Other Ambulatory Visit: Payer: Self-pay

## 2021-05-03 MED ORDER — PEG 3350-KCL-NA BICARB-NACL 420 G PO SOLR
4000.0000 mL | Freq: Once | ORAL | 0 refills | Status: AC
Start: 2021-05-03 — End: 2021-05-03

## 2021-05-03 NOTE — Progress Notes (Signed)
Prep resent to pharmacy.

## 2021-05-04 ENCOUNTER — Telehealth: Payer: Self-pay

## 2021-05-04 NOTE — Telephone Encounter (Signed)
Left message on voicemail to return my call.  

## 2021-05-04 NOTE — Telephone Encounter (Signed)
-----   Message from Ralene Bathe, MD sent at 05/03/2021  4:54 PM EDT ----- Diagnosis Skin , right distal calf WELL DIFFERENTIATED SQUAMOUS CELL CARCINOMA, BASE INVOLVED  Cancer - SCC Already treated Recheck next visit

## 2021-05-07 ENCOUNTER — Telehealth: Payer: Self-pay

## 2021-05-07 NOTE — Telephone Encounter (Signed)
Patient advised of BX results.  °

## 2021-05-07 NOTE — Telephone Encounter (Signed)
-----   Message from Ralene Bathe, MD sent at 05/03/2021  4:54 PM EDT ----- Diagnosis Skin , right distal calf WELL DIFFERENTIATED SQUAMOUS CELL CARCINOMA, BASE INVOLVED  Cancer - SCC Already treated Recheck next visit

## 2021-05-08 ENCOUNTER — Ambulatory Visit
Admission: RE | Admit: 2021-05-08 | Discharge: 2021-05-08 | Disposition: A | Discharge status: Home / Self Care | Payer: Medicare HMO | Attending: Gastroenterology | Admitting: Gastroenterology

## 2021-05-08 ENCOUNTER — Encounter: Payer: Self-pay | Admitting: Gastroenterology

## 2021-05-08 ENCOUNTER — Ambulatory Visit: Payer: Medicare HMO | Admitting: Certified Registered Nurse Anesthetist

## 2021-05-08 ENCOUNTER — Encounter
Admission: RE | Disposition: A | Discharge status: Home / Self Care | Payer: Self-pay | Source: Home / Self Care | Attending: Gastroenterology

## 2021-05-08 DIAGNOSIS — Z1211 Encounter for screening for malignant neoplasm of colon: Secondary | ICD-10-CM | POA: Diagnosis not present

## 2021-05-08 DIAGNOSIS — D126 Benign neoplasm of colon, unspecified: Secondary | ICD-10-CM | POA: Diagnosis not present

## 2021-05-08 DIAGNOSIS — Z86010 Personal history of colonic polyps: Secondary | ICD-10-CM | POA: Diagnosis not present

## 2021-05-08 DIAGNOSIS — D122 Benign neoplasm of ascending colon: Secondary | ICD-10-CM | POA: Diagnosis not present

## 2021-05-08 DIAGNOSIS — K635 Polyp of colon: Secondary | ICD-10-CM

## 2021-05-08 HISTORY — PX: COLONOSCOPY WITH PROPOFOL: SHX5780

## 2021-05-08 HISTORY — DX: Essential (primary) hypertension: I10

## 2021-05-08 SURGERY — COLONOSCOPY WITH PROPOFOL
Anesthesia: General

## 2021-05-08 MED ORDER — PROPOFOL 10 MG/ML IV BOLUS
INTRAVENOUS | Status: DC | PRN
  Administered 2021-05-08: 10 mg via INTRAVENOUS
  Administered 2021-05-08: 70 mg via INTRAVENOUS

## 2021-05-08 MED ORDER — PROPOFOL 500 MG/50ML IV EMUL
INTRAVENOUS | Status: AC
  Filled 2021-05-08: qty 50

## 2021-05-08 MED ORDER — PROPOFOL 500 MG/50ML IV EMUL
INTRAVENOUS | Status: DC | PRN
  Administered 2021-05-08: 140 ug/kg/min via INTRAVENOUS

## 2021-05-08 MED ORDER — LIDOCAINE HCL (CARDIAC) PF 100 MG/5ML IV SOSY
PREFILLED_SYRINGE | INTRAVENOUS | Status: DC | PRN
  Administered 2021-05-08: 100 mg via INTRAVENOUS

## 2021-05-08 MED ORDER — SODIUM CHLORIDE 0.9 % IV SOLN: INTRAVENOUS | Status: DC

## 2021-05-08 NOTE — Anesthesia Preprocedure Evaluation (Signed)
Anesthesia Evaluation  Patient identified by MRN, date of birth, ID band Patient awake    Reviewed: Allergy & Precautions, NPO status , Patient's Chart, lab work & pertinent test results  History of Anesthesia Complications Negative for: history of anesthetic complications  Airway Mallampati: II  TM Distance: >3 FB Neck ROM: Full    Dental  (+) Missing, Chipped,    Pulmonary neg pulmonary ROS, neg sleep apnea, neg COPD, Patient abstained from smoking.Not current smoker,    Pulmonary exam normal breath sounds clear to auscultation       Cardiovascular Exercise Tolerance: Good METShypertension, (-) CAD and (-) Past MI (-) dysrhythmias  Rhythm:Regular Rate:Normal - Systolic murmurs    Neuro/Psych negative neurological ROS  negative psych ROS   GI/Hepatic GERD  Medicated,(+)     (-) substance abuse  ,   Endo/Other  neg diabetes  Renal/GU negative Renal ROS     Musculoskeletal   Abdominal   Peds  Hematology   Anesthesia Other Findings Past Medical History: No date: Actinic keratosis No date: Cancer [facility])     Comment:  skin No date: Hypertension 02/05/2021: SCC (squamous cell carcinoma)     Comment:  R volar forearm, EDC 06/17/2016: Squamous cell carcinoma of skin     Comment:  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 05/02/2021: Squamous cell carcinoma of skin     Comment:  R distal calf - ED&C  Reproductive/Obstetrics                             Anesthesia Physical Anesthesia Plan  ASA: 2  Anesthesia Plan: General   Post-op Pain Management:    Induction: Intravenous  PONV Risk Score and Plan: 2 and Ondansetron, Propofol infusion and TIVA  Airway Management Planned: Nasal Cannula  Additional Equipment: None  Intra-op Plan:   Post-operative Plan:   Informed Consent: I have reviewed the patients History and Physical, chart, labs and discussed the procedure including the risks,  benefits and alternatives for the proposed anesthesia with the patient or authorized representative who has indicated his/her understanding and acceptance.     Dental advisory given  Plan Discussed with: CRNA and Surgeon  Anesthesia Plan Comments: (Discussed risks of anesthesia with patient, including possibility of difficulty with spontaneous ventilation under anesthesia necessitating airway intervention, PONV, and rare risks such as cardiac or respiratory or neurological events, and allergic reactions. Discussed the role of CRNA in patient's perioperative care. Patient understands.)        Anesthesia Quick Evaluation

## 2021-05-08 NOTE — H&P (Signed)
Jonathon Bellows, MD [address], [place], [place], [place], [address] [address], [place], [place], [place], [address] Phone: [phone]  Fax: [phone]  Primary Care Physician:  Olin Hauser, DO   Pre-Procedure History & Physical: HPI:  Zachary Wall is a 70 y.o. male is here for an colonoscopy.   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 Authorizing Provider  aspirin EC 81 MG tablet Take 1 tablet (81 mg total) daily by mouth. 05/05/17  Yes Karamalegos, Devonne Doughty, DO  omeprazole (PRILOSEC) 20 MG capsule TAKE 1 CAPSULE BY MOUTH DAILY BEFORE BREAKFAST. 02/08/21  Yes Karamalegos, Devonne Doughty, DO  valsartan (DIOVAN) 80 MG tablet TAKE 1 TABLET BY MOUTH EVERY DAY 02/19/21  Yes Karamalegos, Devonne Doughty, DO  acetaminophen (TYLENOL) 500 MG chewable tablet Chew 500 mg by mouth every 6 (six) hours as needed for pain.    [provider]  sildenafil (REVATIO) 20 MG tablet Take 3-5 pills about 30 min prior to sex 01/09/21   Olin Hauser, DO    Allergies as of 04/20/2021 - Review Complete 04/17/2021  Allergen Reaction Noted   Ace inhibitors Cough 11/12/2017   Azithromycin Rash 12/13/2014    Family History  Problem Relation Age of Onset   Hematuria Neg Hx    Prostate cancer Neg Hx    Tuberculosis Neg Hx    Kidney cancer Neg Hx    Bladder Cancer Neg Hx     Social History   Socioeconomic History   Marital status: Married    Spouse name: Not on file   Number of children: Not on file    Years of education: Not on file   Highest education level: Not on file  Occupational History   Not on file  Tobacco Use   Smoking status: Never   Smokeless tobacco: Never  Substance and Sexual Activity   Alcohol use: No   Drug use: No   Sexual activity: Yes  Other Topics Concern   Not on file  Social History Narrative   Not on file   Social Determinants of Health   Financial Resource Strain: Low Risk    Difficulty of Paying Living Expenses: Not hard at all  Food Insecurity: No Food Insecurity   Worried About Charity fundraiser in the Last Year: Never true   Ran Out of Food in the Last Year: Never true  Transportation Needs: No Transportation Needs   Lack of Transportation (Medical): No   Lack of Transportation (Non-Medical): No  Physical Activity: Sufficiently Active   Days of Exercise per Week: 7 days   Minutes of Exercise per Session: 30 min  Stress: No Stress Concern Present   Feeling of Stress : Not at all  Social Connections: Not on file  Intimate Partner Violence: Not on file    Review of Systems: See HPI, otherwise negative ROS  Physical Exam: There were no vitals  taken for this visit. General:   Alert,  pleasant and cooperative in NAD Head:  Normocephalic and atraumatic. Neck:  Supple; no masses or thyromegaly. Lungs:  Clear throughout to auscultation, normal respiratory effort.    Heart:  +S1, +S2, Regular rate and rhythm, No edema. Abdomen:  Soft, nontender and nondistended. Normal bowel sounds, without guarding, and without rebound.   Neurologic:  Alert and  oriented x4;  grossly normal neurologically.  Impression/Plan: Zachary Wall is here for an colonoscopy to be performed for surveillance due to prior history of colon polyps   Risks, benefits, limitations, and alternatives regarding  colonoscopy have been reviewed with the patient.  Questions have been answered.  All parties agreeable.   Jonathon Bellows, MD  05/08/2021, 8:51 AM

## 2021-05-08 NOTE — Op Note (Signed)
[facility] Gastroenterology Patient Name: Zachary Wall Procedure Date: 05/08/2021 8:51 AM MRN: [id number] Account #: [id number] Date of Birth: [birth year] Admit Type: Outpatient Age: 70 Room: [facility] ENDO ROOM 2 Gender: Male Note Status: Finalized Instrument Name: Park Meo [id number] Procedure:             Colonoscopy Indications:           Surveillance: Personal history of colonic polyps                         (unknown histology) on last colonoscopy 5 years ago Providers:             Jonathon Bellows MD, MD Referring MD:          Olin Hauser (Referring MD) Medicines:             Monitored Anesthesia Care Complications:         No immediate complications. Procedure:             Pre-Anesthesia Assessment:                        - Prior to the procedure, a History and Physical was                         performed, and patient medications, allergies and                         sensitivities were reviewed. The patient's tolerance                         of previous anesthesia was reviewed.                        - The risks and benefits of the procedure and the                         sedation options and risks were discussed with the                         patient. All questions were answered and informed                         consent was obtained.                        - ASA Grade Assessment: II - A patient with mild                         systemic disease.                        After obtaining informed consent, the colonoscope was                         passed under direct vision. Throughout the procedure,                         the patient's blood pressure, pulse, and oxygen  saturations were monitored continuously. The                         Colonoscope was introduced through the anus and                         advanced to the the cecum, identified by the                         appendiceal orifice. The colonoscopy was  performed                         with ease. The patient tolerated the procedure well.                         The quality of the bowel preparation was good. Findings:      The perianal and digital rectal examinations were normal.      Two sessile polyps were found in the ascending colon. The polyps were 4       to 6 mm in size. These polyps were removed with a cold snare. Resection       and retrieval were complete.      The exam was otherwise without abnormality on direct and retroflexion       views. Impression:            - Two 4 to 6 mm polyps in the ascending colon, removed                         with a cold snare. Resected and retrieved.                        - The examination was otherwise normal on direct and                         retroflexion views. Recommendation:        - Discharge patient to home (with escort).                        - Resume previous diet.                        - Continue present medications.                        - Await pathology results.                        - Repeat colonoscopy for surveillance based on                         pathology results. Procedure Code(s):     --- Professional ---                        [phone], Colonoscopy, flexible; with removal of                         tumor(s), polyp(s), or other lesion(s) by snare  technique Diagnosis Code(s):     --- Professional ---                        Z86.010, Personal history of colonic polyps                        K63.5, Polyp of colon CPT copyright 2019 American Medical Association. All rights reserved. The codes documented in this report are preliminary and upon coder review may  be revised to meet current compliance requirements. Jonathon Bellows, MD Jonathon Bellows MD, MD 05/08/2021 9:26:12 AM This report has been signed electronically. Number of Addenda: 0 Note Initiated On: 05/08/2021 8:51 AM Scope Withdrawal Time: 0 hours 9 minutes 40 seconds  Total Procedure  Duration: 0 hours 11 minutes 43 seconds  Estimated Blood Loss:  Estimated blood loss: none.      [facility]

## 2021-05-08 NOTE — Transfer of Care (Signed)
Immediate Anesthesia Transfer of Care Note  Patient: Zachary Wall  Procedure(s) Performed: COLONOSCOPY WITH PROPOFOL  Patient Location: Endoscopy Unit  Anesthesia Type:General  Level of Consciousness: drowsy  Airway & Oxygen Therapy: Patient Spontanous Breathing  Post-op Assessment: Report given to RN and Post -op Vital signs reviewed and stable  Post vital signs: Reviewed and stable  Last Vitals:  Vitals Value Taken Time  BP 109/52 05/08/21 0927  Temp    Pulse 55 05/08/21 0927  Resp 19 05/08/21 0927  SpO2 96 % 05/08/21 0927  Vitals shown include unvalidated device data.  Last Pain:  Vitals:   05/08/21 0845  TempSrc: Temporal         Complications: No notable events documented.

## 2021-05-08 NOTE — Anesthesia Postprocedure Evaluation (Signed)
Anesthesia Post Note  Patient: Zachary Wall  Procedure(s) Performed: COLONOSCOPY WITH PROPOFOL  Patient location during evaluation: Endoscopy Anesthesia Type: General Level of consciousness: awake and alert Pain management: pain level controlled Vital Signs Assessment: post-procedure vital signs reviewed and stable Respiratory status: spontaneous breathing, nonlabored ventilation, respiratory function stable and patient connected to nasal cannula oxygen Cardiovascular status: blood pressure returned to baseline and stable Postop Assessment: no apparent nausea or vomiting Anesthetic complications: no   No notable events documented.   Last Vitals:  Vitals:   05/08/21 0927 05/08/21 0937  BP: (!) 109/52 (!) 96/51  Pulse: (!) 55   Resp: (!) 22   Temp: (!) 36.3 C   SpO2: 96%     Last Pain:  Vitals:   05/08/21 0947  TempSrc:   PainSc: 0-No pain                 Arita Miss

## 2021-05-08 NOTE — Anesthesia Procedure Notes (Signed)
Date/Time: 05/08/2021 9:10 AM Performed by: Lily Peer, Akima Slaugh, CRNA Pre-anesthesia Checklist: Patient identified, Emergency Drugs available, Suction available, Patient being monitored and Timeout performed Patient Re-evaluated:Patient Re-evaluated prior to induction Oxygen Delivery Method: Nasal cannula Induction Type: IV induction

## 2021-05-09 ENCOUNTER — Encounter: Payer: Self-pay | Admitting: Gastroenterology

## 2021-05-10 LAB — SURGICAL PATHOLOGY

## 2021-05-14 ENCOUNTER — Encounter: Payer: Self-pay | Admitting: Gastroenterology

## 2021-05-25 IMAGING — DX DG LUMBAR SPINE COMPLETE 4+V
5 series · 5 of 5 positions shown · non-contrast
Comparison: None.

CLINICAL DATA: Low back pain with left-sided sciatica

EXAM:
LUMBAR SPINE - COMPLETE 4+ VIEW

[l-spine obl (1 of 2)]
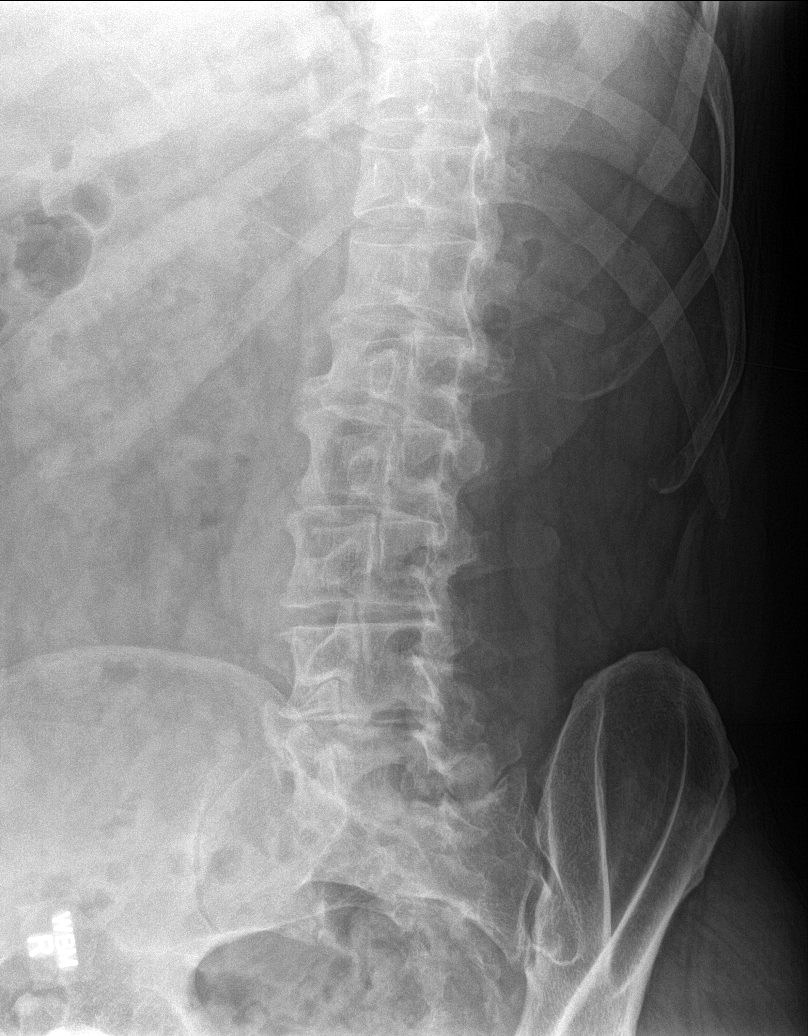

[l-spine obl (2 of 2)]
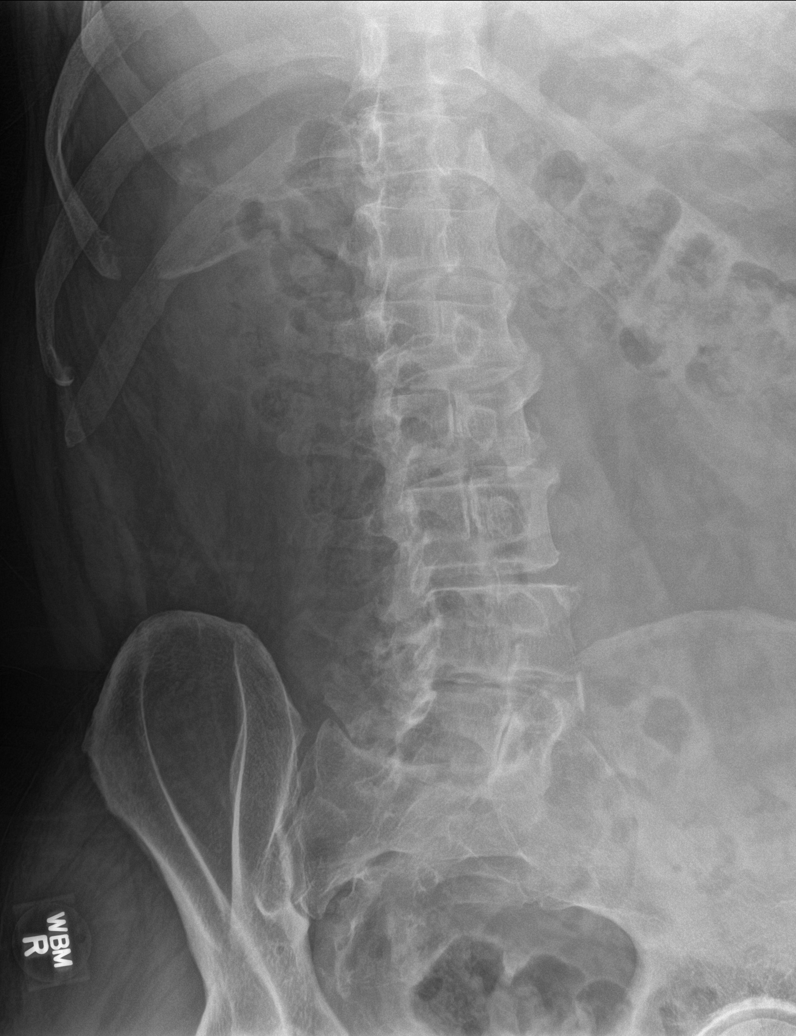

[l-spine spot]
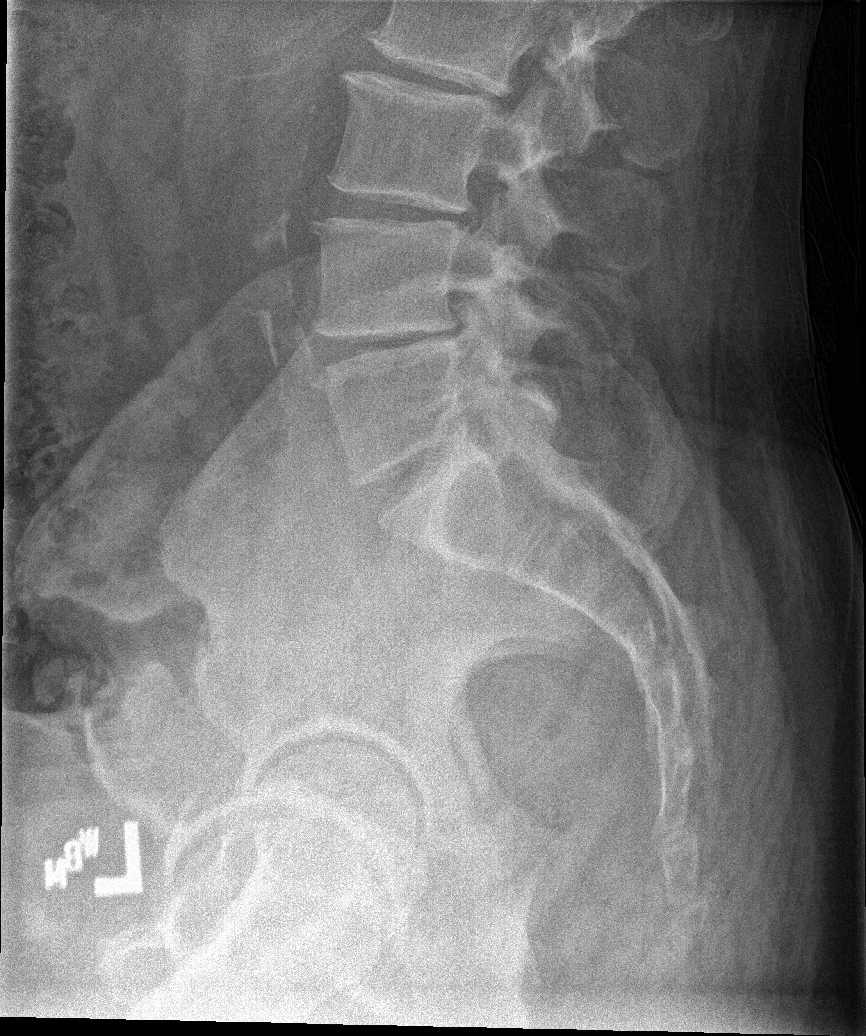

[l-spine ap]
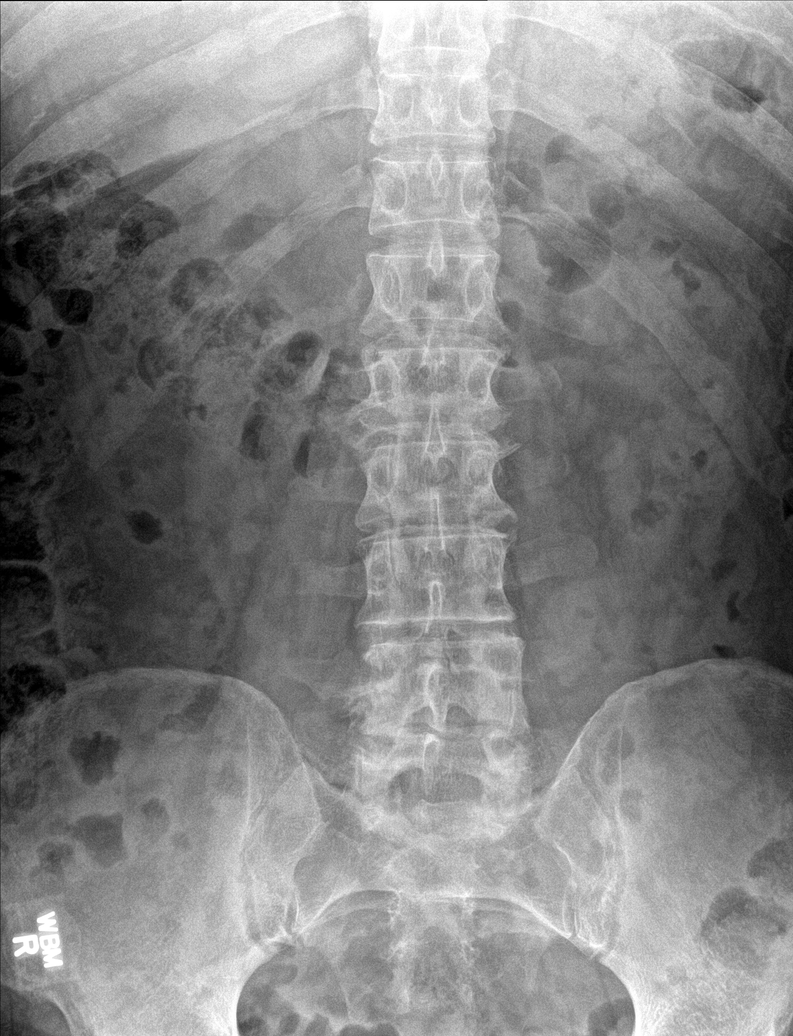

[l-spine lat]
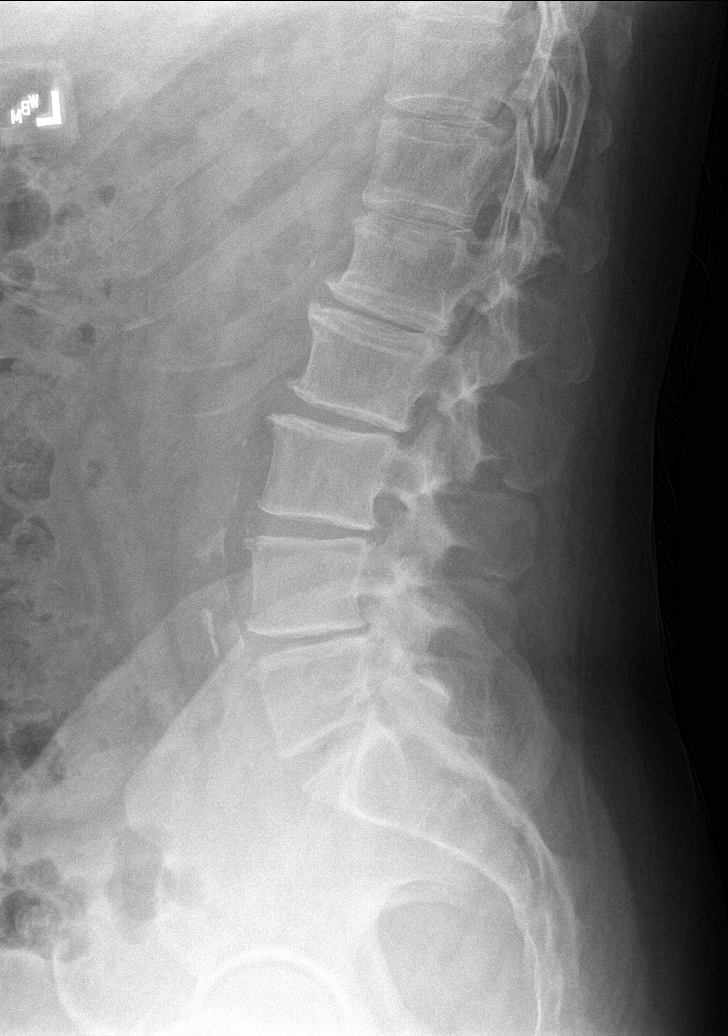

[5 of 5 positions shown; findings below may reference images not displayed]

FINDINGS: Frontal, lateral, spot lumbosacral lateral, and bilateral oblique
views were obtained. There are 5 non-rib-bearing lumbar type
vertebral bodies. There is no fracture or spondylolisthesis. There
is moderately severe disc space narrowing at L4-5. There is moderate
disc space narrowing at L1-2, L2-3, and L3-4. There is facet
osteoarthritic change at L3-4, L4-5, and L5-S1 bilaterally, tending
to be more severe on the left than on the right. There is aortic
atherosclerosis.
IMPRESSION: Osteoarthritic change at several levels. Disc space narrowing most
notable at L4-5. No fracture or spondylolisthesis.

Aortic Atherosclerosis ([id number]).

## 2021-07-03 ENCOUNTER — Other Ambulatory Visit: Payer: Self-pay

## 2021-08-02 ENCOUNTER — Other Ambulatory Visit: Payer: Self-pay | Admitting: Family Medicine

## 2021-08-02 DIAGNOSIS — K219 Gastro-esophageal reflux disease without esophagitis: Secondary | ICD-10-CM

## 2021-08-02 NOTE — Telephone Encounter (Signed)
Requested Prescriptions  Pending Prescriptions Disp Refills   omeprazole (PRILOSEC) 20 MG capsule [Pharmacy Med Name: OMEPRAZOLE DR 20 MG CAPSULE] 90 capsule 1    Sig: TAKE 1 CAPSULE BY MOUTH EVERY DAY BEFORE BREAKFAST     Gastroenterology: Proton Pump Inhibitors Passed - 08/02/2021  2:11 AM      Passed - Valid encounter within last 12 months    Recent Outpatient Visits          6 months ago Annual physical exam   [place], DO   1 year ago Annual physical exam   [facility] Olin Hauser, DO   1 year ago Acute left-sided low back pain with left-sided sciatica   [place], DO   1 year ago Acute left-sided low back pain with right-sided sciatica   [place], DO   2 years ago Close exposure to COVID-19 virus   [place], Wendee Beavers, [place]      Future Appointments            In 1 month [facility], [facility]

## 2021-08-14 ENCOUNTER — Other Ambulatory Visit: Payer: Self-pay | Admitting: Family Medicine

## 2021-08-14 DIAGNOSIS — I10 Essential (primary) hypertension: Secondary | ICD-10-CM

## 2021-08-14 NOTE — Telephone Encounter (Signed)
Requested medication (s) are due for refill today: yes  Requested medication (s) are on the active medication list: yes  Last refill:  02/19/21 #90 1 refills  Future visit scheduled: no , future appt scheduled with nurse health adviser in 1 month   Notes to clinic:  do you want to give courtesy refill? No future visit scheduled with PCP     Requested Prescriptions  Pending Prescriptions Disp Refills   valsartan (DIOVAN) 80 MG tablet [Pharmacy Med Name: VALSARTAN 80 MG TABLET] 90 tablet 1    Sig: TAKE 1 TABLET BY MOUTH EVERY DAY     Cardiovascular:  Angiotensin Receptor Blockers Failed - 08/14/2021  1:50 AM      Failed - Cr in normal range and within 180 days    Creat  Date Value Ref Range Status  01/02/2021 0.95 0.70 - 1.25 mg/dL Final    Comment:    For patients >24 years of age, the reference limit for Creatinine is approximately 13% higher for people identified as African-American. .           Failed - K in normal range and within 180 days    Potassium  Date Value Ref Range Status  01/02/2021 4.3 3.5 - 5.3 mmol/L Final          Failed - Valid encounter within last 6 months    Recent Outpatient Visits           7 months ago Annual physical exam   [place], DO   1 year ago Annual physical exam   [place], DO   1 year ago Acute left-sided low back pain with left-sided sciatica   [place], DO   1 year ago Acute left-sided low back pain with right-sided sciatica   [place], DO   2 years ago Close exposure to COVID-19 virus   [place], [place]       Future Appointments             In 1 month [facility], [place] - Patient is not pregnant      Passed - Last BP in normal range    BP Readings from Last 1 Encounters:   05/08/21 (!) 96/51

## 2021-08-16 ENCOUNTER — Ambulatory Visit: Payer: Medicare HMO | Admitting: Dermatology

## 2021-08-16 ENCOUNTER — Other Ambulatory Visit: Payer: Self-pay

## 2021-08-16 DIAGNOSIS — L570 Actinic keratosis: Secondary | ICD-10-CM | POA: Diagnosis not present

## 2021-08-16 DIAGNOSIS — Z85828 Personal history of other malignant neoplasm of skin: Secondary | ICD-10-CM

## 2021-08-16 DIAGNOSIS — B351 Tinea unguium: Secondary | ICD-10-CM

## 2021-08-16 DIAGNOSIS — Z86007 Personal history of in-situ neoplasm of skin: Secondary | ICD-10-CM

## 2021-08-16 DIAGNOSIS — Z1283 Encounter for screening for malignant neoplasm of skin: Secondary | ICD-10-CM

## 2021-08-16 DIAGNOSIS — D229 Melanocytic nevi, unspecified: Secondary | ICD-10-CM

## 2021-08-16 DIAGNOSIS — L578 Other skin changes due to chronic exposure to nonionizing radiation: Secondary | ICD-10-CM | POA: Diagnosis not present

## 2021-08-16 DIAGNOSIS — B353 Tinea pedis: Secondary | ICD-10-CM | POA: Diagnosis not present

## 2021-08-16 DIAGNOSIS — D1800 Hemangioma unspecified site: Secondary | ICD-10-CM | POA: Diagnosis not present

## 2021-08-16 DIAGNOSIS — L821 Other seborrheic keratosis: Secondary | ICD-10-CM

## 2021-08-16 DIAGNOSIS — L814 Other melanin hyperpigmentation: Secondary | ICD-10-CM | POA: Diagnosis not present

## 2021-08-16 MED ORDER — KETOCONAZOLE 2 % EX CREA: 1.0000 "application " | TOPICAL_CREAM | Freq: Every day | CUTANEOUS | 4 refills | Status: AC

## 2021-08-16 MED ORDER — TAVABOROLE 5 % EX SOLN: 1.0000 "application " | Freq: Every day | CUTANEOUS | 11 refills | Status: DC

## 2021-08-16 NOTE — Patient Instructions (Signed)

## 2021-08-16 NOTE — Progress Notes (Signed)
Follow-Up Visit   Subjective  Zachary Wall is a 71 y.o. male who presents for the following: Total body skin exam (Hx of SCC R calf, R volar forearm, L proximal volar forearm, hx of AKs). The patient presents for Total-Body Skin Exam (TBSE) for skin cancer screening and mole check.  The patient has spots, moles and lesions to be evaluated, some may be new or changing and the patient has concerns that these could be cancer.  The following portions of the chart were reviewed this encounter and updated as appropriate:   Tobacco   Allergies   Meds   Problems   Med Hx   Surg Hx   Fam Hx      Review of Systems:  No other skin or systemic complaints except as noted in HPI or Assessment and Plan.  Objective  Well appearing patient in no apparent distress; mood and affect are within normal limits.  A full examination was performed including scalp, head, eyes, ears, nose, lips, neck, chest, axillae, abdomen, back, buttocks, bilateral upper extremities, bilateral lower extremities, hands, feet, fingers, toes, fingernails, and toenails. All findings within normal limits unless otherwise noted below.  L ear x 1, R dorsum hand x 3, L dorsum hand x 3 (7) Pink scaly macules  R 3rd toenail Toenail dystrophy, scaling feet   Assessment & Plan   History of Squamous Cell Carcinoma of the Skin - No evidence of recurrence today - No lymphadenopathy - Recommend regular full body skin exams - Recommend daily broad spectrum sunscreen SPF 30+ to sun-exposed areas, reapply every 2 hours as needed.  - Call if any new or changing lesions are noted between office visits - R distal calf, R volar forearm  History of Squamous Cell Carcinoma in Situ of the Skin - No evidence of recurrence today - Recommend regular full body skin exams - Recommend daily broad spectrum sunscreen SPF 30+ to sun-exposed areas, reapply every 2 hours as needed.  - Call if any new or changing lesions are noted between office visits   -L prox volar forearm  Lentigines - Scattered tan macules - Due to sun exposure - Benign-appearing, observe - Recommend daily broad spectrum sunscreen SPF 30+ to sun-exposed areas, reapply every 2 hours as needed. - Call for any changes  Seborrheic Keratoses - Stuck-on, waxy, tan-brown papules and/or plaques  - Benign-appearing - Discussed benign etiology and prognosis. - Observe - Call for any changes  Melanocytic Nevi - Tan-brown and/or pink-flesh-colored symmetric macules and papules - Benign appearing on exam today - Observation - Call clinic for new or changing moles - Recommend daily use of broad spectrum spf 30+ sunscreen to sun-exposed areas.   Hemangiomas - Red papules - Discussed benign nature - Observe - Call for any changes  Actinic Damage - Severe, confluent actinic changes with pre-cancerous actinic keratoses  - Severe, chronic, not at goal, secondary to cumulative UV radiation exposure over time - diffuse scaly erythematous macules and papules with underlying dyspigmentation - Discussed Prescription "Field Treatment" for Severe, Chronic Confluent Actinic Changes with Pre-Cancerous Actinic Keratoses Field treatment involves treatment of an entire area of skin that has confluent Actinic Changes (Sun/ Ultraviolet light damage) and PreCancerous Actinic Keratoses by method of PhotoDynamic Therapy (PDT) and/or prescription Topical Chemotherapy agents such as 5-fluorouracil, 5-fluorouracil/calcipotriene, and/or imiquimod.  The purpose is to decrease the number of clinically evident and subclinical PreCancerous lesions to prevent progression to development of skin cancer by chemically destroying early precancer changes that  may or may not be visible.  It has been shown to reduce the risk of developing skin cancer in the treated area. As a result of treatment, redness, scaling, crusting, and open sores may occur during treatment course. One or more than one of these methods  may be used and may have to be used several times to control, suppress and eliminate the PreCancerous changes. Discussed treatment course, expected reaction, and possible side effects. - Recommend daily broad spectrum sunscreen SPF 30+ to sun-exposed areas, reapply every 2 hours as needed.  - Staying in the shade or wearing long sleeves, sun glasses (UVA+UVB protection) and wide brim hats (4-inch brim around the entire circumference of the hat) are also recommended. - Call for new or changing lesions.   Skin cancer screening performed today.   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 Condition is symptomatic/ bothersome to patient.  Flared. Start Kerydin solution qhs Start Ketoconazole 2% cr qhs to feet  Tavaborole (KERYDIN) 5 % SOLN - R 3rd toenail Apply 1 application topically at bedtime. Qhs to toenails  ketoconazole (NIZORAL) 2 % cream - R 3rd toenail Apply 1 application topically at bedtime. Qhs to feet  Skin cancer screening  Return in about 6 months (around 02/13/2022) for Hx of AKs, sun exposed areas.  I, Zachary Wall, RMA, am acting as scribe for Sarina Ser, MD .  Documentation: I have reviewed the above documentation for accuracy and completeness, and I agree with the above.  Sarina Ser, MD

## 2021-08-25 ENCOUNTER — Encounter: Payer: Self-pay | Admitting: Dermatology

## 2021-09-21 ENCOUNTER — Other Ambulatory Visit: Payer: Self-pay

## 2021-09-21 ENCOUNTER — Ambulatory Visit (INDEPENDENT_AMBULATORY_CARE_PROVIDER_SITE_OTHER): Payer: Medicare HMO

## 2021-09-21 VITALS — BP 140/78 | HR 66 | Temp 97.8°F | Ht 73.0 in | Wt 242.0 lb

## 2021-09-21 DIAGNOSIS — Z0000 Encounter for general adult medical examination without abnormal findings: Secondary | ICD-10-CM

## 2021-09-21 NOTE — Patient Instructions (Signed)
Mr. Winokur , ?Thank you for taking time to come for your Medicare Wellness Visit. I appreciate your ongoing commitment to your health goals. Please review the following plan we discussed and let me know if I can assist you in the future.  ? ?Screening recommendations/referrals: ?Colonoscopy: 05/08/21, every 5 years ?Recommended yearly ophthalmology/optometry visit for glaucoma screening and checkup ?Recommended yearly dental visit for hygiene and checkup ? ?Vaccinations: ?Influenza vaccine: n/d ?Pneumococcal vaccine: n/d ?Tdap vaccine: 12/13/14 ?Shingles vaccine: n/d   ?Covid-19: 08/25/19, 09/14/19, 04/17/20 ? ?Advanced directives: no ? ?Conditions/risks identified: none ? ?Next appointment: Follow up in one year for your annual wellness visit. 09/27/22 @ 9am in person ? ?Preventive Care 69 Years and Older, Male ?Preventive care refers to lifestyle choices and visits with your health care provider that can promote health and wellness. ?What does preventive care include? ?A yearly physical exam. This is also called an annual well check. ?Dental exams once or twice a year. ?Routine eye exams. Ask your health care provider how often you should have your eyes checked. ?Personal lifestyle choices, including: ?Daily care of your teeth and gums. ?Regular physical activity. ?Eating a healthy diet. ?Avoiding tobacco and drug use. ?Limiting alcohol use. ?Practicing safe sex. ?Taking low doses of aspirin every day. ?Taking vitamin and mineral supplements as recommended by your health care provider. ?What happens during an annual well check? ?The services and screenings done by your health care provider during your annual well check will depend on your age, overall health, lifestyle risk factors, and family history of disease. ?Counseling  ?Your health care provider may ask you questions about your: ?Alcohol use. ?Tobacco use. ?Drug use. ?Emotional well-being. ?Home and relationship well-being. ?Sexual activity. ?Eating  habits. ?History of falls. ?Memory and ability to understand (cognition). ?Work and work Statistician. ?Screening  ?You may have the following tests or measurements: ?Height, weight, and BMI. ?Blood pressure. ?Lipid and cholesterol levels. These may be checked every 5 years, or more frequently if you are over 36 years old. ?Skin check. ?Lung cancer screening. You may have this screening every year starting at age 39 if you have a 30-pack-year history of smoking and currently smoke or have quit within the past 15 years. ?Fecal occult blood test (FOBT) of the stool. You may have this test every year starting at age 69. ?Flexible sigmoidoscopy or colonoscopy. You may have a sigmoidoscopy every 5 years or a colonoscopy every 10 years starting at age 69. ?Prostate cancer screening. Recommendations will vary depending on your family history and other risks. ?Hepatitis C blood test. ?Hepatitis B blood test. ?Sexually transmitted disease (STD) testing. ?Diabetes screening. This is done by checking your blood sugar (glucose) after you have not eaten for a while (fasting). You may have this done every 1-3 years. ?Abdominal aortic aneurysm (AAA) screening. You may need this if you are a current or former smoker. ?Osteoporosis. You may be screened starting at age 5 if you are at high risk. ?Talk with your health care provider about your test results, treatment options, and if necessary, the need for more tests. ?Vaccines  ?Your health care provider may recommend certain vaccines, such as: ?Influenza vaccine. This is recommended every year. ?Tetanus, diphtheria, and acellular pertussis (Tdap, Td) vaccine. You may need a Td booster every 10 years. ?Zoster vaccine. You may need this after age 12. ?Pneumococcal 13-valent conjugate (PCV13) vaccine. One dose is recommended after age 35. ?Pneumococcal polysaccharide (PPSV23) vaccine. One dose is recommended after age 72. ?Talk to your  health care provider about which screenings and  vaccines you need and how often you need them. ?This information is not intended to replace advice given to you by your health care provider. Make sure you discuss any questions you have with your health care provider. ?Document Released: 07/14/2015 Document Revised: 03/06/2016 Document Reviewed: 04/18/2015 ?Elsevier Interactive Patient Education ? 2017 [place]. ? ?Fall Prevention in the Home ?Falls can cause injuries. They can happen to people of all ages. There are many things you can do to make your home safe and to help prevent falls. ?What can I do on the outside of my home? ?Regularly fix the edges of walkways and driveways and fix any cracks. ?Remove anything that might make you trip as you walk through a door, such as a raised step or threshold. ?Trim any bushes or trees on the path to your home. ?Use bright outdoor lighting. ?Clear any walking paths of anything that might make someone trip, such as rocks or tools. ?Regularly check to see if handrails are loose or broken. Make sure that both sides of any steps have handrails. ?Any raised decks and porches should have guardrails on the edges. ?Have any leaves, snow, or ice cleared regularly. ?Use sand or salt on walking paths during winter. ?Clean up any spills in your garage right away. This includes oil or grease spills. ?What can I do in the bathroom? ?Use night lights. ?Install grab bars by the toilet and in the tub and shower. Do not use towel bars as grab bars. ?Use non-skid mats or decals in the tub or shower. ?If you need to sit down in the shower, use a plastic, non-slip stool. ?Keep the floor dry. Clean up any water that spills on the floor as soon as it happens. ?Remove soap buildup in the tub or shower regularly. ?Attach bath mats securely with double-sided non-slip rug tape. ?Do not have throw rugs and other things on the floor that can make you trip. ?What can I do in the bedroom? ?Use night lights. ?Make sure that you have a light by your  bed that is easy to reach. ?Do not use any sheets or blankets that are too big for your bed. They should not hang down onto the floor. ?Have a firm chair that has side arms. You can use this for support while you get dressed. ?Do not have throw rugs and other things on the floor that can make you trip. ?What can I do in the kitchen? ?Clean up any spills right away. ?Avoid walking on wet floors. ?Keep items that you use a lot in easy-to-reach places. ?If you need to reach something above you, use a strong step stool that has a grab bar. ?Keep electrical cords out of the way. ?Do not use floor polish or wax that makes floors slippery. If you must use wax, use non-skid floor wax. ?Do not have throw rugs and other things on the floor that can make you trip. ?What can I do with my stairs? ?Do not leave any items on the stairs. ?Make sure that there are handrails on both sides of the stairs and use them. Fix handrails that are broken or loose. Make sure that handrails are as long as the stairways. ?Check any carpeting to make sure that it is firmly attached to the stairs. Fix any carpet that is loose or worn. ?Avoid having throw rugs at the top or bottom of the stairs. If you do have throw rugs, attach them  to the floor with carpet tape. ?Make sure that you have a light switch at the top of the stairs and the bottom of the stairs. If you do not have them, ask someone to add them for you. ?What else can I do to help prevent falls? ?Wear shoes that: ?Do not have high heels. ?Have rubber bottoms. ?Are comfortable and fit you well. ?Are closed at the toe. Do not wear sandals. ?If you use a stepladder: ?Make sure that it is fully opened. Do not climb a closed stepladder. ?Make sure that both sides of the stepladder are locked into place. ?Ask someone to hold it for you, if possible. ?Clearly mark and make sure that you can see: ?Any grab bars or handrails. ?First and last steps. ?Where the edge of each step is. ?Use tools that  help you move around (mobility aids) if they are needed. These include: ?Canes. ?Walkers. ?Scooters. ?Crutches. ?Turn on the lights when you go into a dark area. Replace any light bulbs as soon as they burn out. ?Se

## 2021-09-21 NOTE — Progress Notes (Signed)
? ?Subjective:  ? JAG LENZ is a 71 y.o. male who presents for Medicare Annual/Subsequent preventive examination. ? ?Review of Systems    ? ?  ? ?   ?Objective:  ?  ?Today's Vitals  ? 09/21/21 0853  ?BP: 140/78  ?Pulse: 66  ?Temp: 97.8 ?F (36.6 ?C)  ?TempSrc: Oral  ?SpO2: 99%  ?Weight: 242 lb (109.8 kg)  ?Height: '6\' 1"'$  (1.854 m)  ? ?Body mass index is 31.93 kg/m?. ? ? ?  05/08/2021  ?  8:43 AM 09/19/2020  ?  8:21 AM 08/18/2018  ?  3:31 PM  ?Advanced Directives  ?Does Patient Have a Medical Advance Directive? No Yes No  ?Type of Corporate treasurer of [place];Living will   ?Copy of [place] in Chart?  No - copy requested   ? ? ?Current Medications (verified) ?Outpatient Encounter Medications as of 09/21/2021  ?Medication Sig  ? acetaminophen (TYLENOL) 500 MG chewable tablet Chew 500 mg by mouth every 6 (six) hours as needed for pain.  ? aspirin EC 81 MG tablet Take 1 tablet (81 mg total) daily by mouth.  ? ketoconazole (NIZORAL) 2 % cream Apply 1 application topically at bedtime. Qhs to feet  ? omeprazole (PRILOSEC) 20 MG capsule TAKE 1 CAPSULE BY MOUTH EVERY DAY BEFORE BREAKFAST  ? polyethylene glycol-electrolytes (NULYTELY) 420 g solution PLEASE SEE ATTACHED FOR DETAILED DIRECTIONS  ? sildenafil (REVATIO) 20 MG tablet Take 3-5 pills about 30 min prior to sex  ? Tavaborole (KERYDIN) 5 % SOLN Apply 1 application topically at bedtime. Qhs to toenails  ? valsartan (DIOVAN) 80 MG tablet TAKE 1 TABLET BY MOUTH EVERY DAY  ? ?No facility-administered encounter medications on file as of 09/21/2021.  ? ? ?Allergies (verified) ?Ace inhibitors and Azithromycin  ? ?History: ?Past Medical History:  ?Diagnosis Date  ? Actinic keratosis   ? Cancer [facility])   ? skin  ? Hypertension   ? SCC (squamous cell carcinoma) 02/05/2021  ? R volar forearm, EDC  ? Squamous cell carcinoma of skin 06/17/2016  ? 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  ? Squamous cell carcinoma of skin 05/02/2021  ? R distal calf -  ED&C  ? ?Past Surgical History:  ?Procedure Laterality Date  ? COLONOSCOPY WITH PROPOFOL    ? COLONOSCOPY WITH PROPOFOL N/A 05/08/2021  ? Procedure: COLONOSCOPY WITH PROPOFOL;  Surgeon: Jonathon Bellows, MD;  Location: [facility] ENDOSCOPY;  Service: Gastroenterology;  Laterality: N/A;  ? SKIN CANCER EXCISION    ? ?Family History  ?Problem Relation Age of Onset  ? Hematuria Neg Hx   ? Prostate cancer Neg Hx   ? Tuberculosis Neg Hx   ? Kidney cancer Neg Hx   ? Bladder Cancer Neg Hx   ? ?Social History  ? ?Socioeconomic History  ? Marital status: Married  ?  Spouse name: Not on file  ? Number of children: Not on file  ? Years of education: Not on file  ? Highest education level: Not on file  ?Occupational History  ? Not on file  ?Tobacco Use  ? Smoking status: Never  ? Smokeless tobacco: Never  ?Substance and Sexual Activity  ? Alcohol use: No  ? Drug use: No  ? Sexual activity: Yes  ?Other Topics Concern  ? Not on file  ?Social History Narrative  ? Not on file  ? ?Social Determinants of Health  ? ?Financial Resource Strain: Not on file  ?Food Insecurity: Not on file  ?Transportation  Needs: Not on file  ?Physical Activity: Not on file  ?Stress: Not on file  ?Social Connections: Not on file  ? ? ?Tobacco Counseling ?Counseling given: Not Answered ? ? ?Clinical Intake: ? ?Pre-visit preparation completed: Yes ? ?Pain : No/denies pain ? ?  ? ?Nutritional Risks: None ?Diabetes: No ? ?How often do you need to have someone help you when you read instructions, pamphlets, or other written materials from your doctor or pharmacy?: 1 - Never ? ?Diabetic?no ? ?Interpreter Needed?: No ? ?Information entered by :: Kirke Shaggy, LPN ? ? ?Activities of Daily Living ?   ? View : No data to display.  ?  ?  ?  ? ? ?Patient Care Team: ?Olin Hauser, DO as PCP - General (Family Medicine) ? ?Indicate any recent Medical Services you may have received from other than [facility] providers in the past year (date may be approximate). ? ?    ?Assessment:  ? This is a routine wellness examination for [place]. ? ?Hearing/Vision screen ?No results found. ? ?Dietary issues and exercise activities discussed: ?  ? ? Goals Addressed   ?None ?  ? ?Depression Screen ? ?  01/09/2021  ?  2:19 PM 09/19/2020  ?  8:21 AM 01/07/2020  ?  2:01 PM 08/31/2019  ?  2:23 PM 01/05/2019  ?  2:00 PM 08/18/2018  ?  3:51 PM 05/18/2018  ?  4:05 PM  ?PHQ 2/9 Scores  ?PHQ - 2 Score 0 0 0 0 0 0 0  ?PHQ- 9 Score 0        ?  ?Fall Risk ? ?  01/09/2021  ?  2:19 PM 09/19/2020  ?  8:21 AM 01/07/2020  ?  2:01 PM 08/31/2019  ?  2:17 PM 01/05/2019  ?  2:00 PM  ?Fall Risk   ?Falls in the past year? 0 0 0 0 0  ?Number falls in past yr: 0  0 0   ?Injury with Fall? 0  0 0   ?Risk for fall due to :  Medication side effect     ?Follow up Falls evaluation completed Falls evaluation completed;Education provided;Falls prevention discussed Falls evaluation completed  Falls evaluation completed  ? ? ?FALL RISK PREVENTION PERTAINING TO THE HOME: ? ?Any stairs in or around the home? Yes  ?If so, are there any without handrails? No  ?Home free of loose throw rugs in walkways, pet beds, electrical cords, etc? Yes  ?Adequate lighting in your home to reduce risk of falls? Yes  ? ?ASSISTIVE DEVICES UTILIZED TO PREVENT FALLS: ? ?Life alert? No  ?Use of a cane, walker or w/c? No  ?Grab bars in the bathroom? Yes  ?Shower chair or bench in shower? Yes  ?Elevated toilet seat or a handicapped toilet? Yes  ? ?TIMED UP AND GO: ? ?Was the test performed? Yes .  ?Length of time to ambulate 10 feet: 4 sec.  ? ?Gait steady and fast without use of assistive device ? ?Cognitive Function: ? ?  08/18/2018  ?  3:54 PM  ?MMSE - Mini Mental State Exam  ?Orientation to time 5  ?Orientation to Place 5  ?Registration 3  ?Attention/ Calculation 5  ?Recall 3  ?Language- name 2 objects 2  ?Language- repeat 1  ?Language- follow 3 step command 3  ?Language- read & follow direction 1  ?Write a sentence 1  ?Copy design 1  ?Total score 30  ? ?  ? ?   09/19/2020  ?  8:23  AM  ?6CIT Screen  ?What Year? 0 points  ?What month? 0 points  ?What time? 0 points  ?Count back from 20 0 points  ?Months in reverse 2 points  ?Repeat phrase 2 points  ?Total Score 4 points  ? ? ?Immunizations ?Immunization History  ?Administered Date(s) Administered  ? PFIZER(Purple Top)SARS-COV-2 Vaccination 08/25/2019, 09/14/2019, 04/17/2020  ? Tdap 12/13/2014  ? ? ?TDAP status: Up to date ? ?Flu Vaccine status: Declined, Education has been provided regarding the importance of this vaccine but patient still declined. Advised may receive this vaccine at local pharmacy or Health Dept. Aware to provide a copy of the vaccination record if obtained from local pharmacy or Health Dept. Verbalized acceptance and understanding. ? ?Pneumococcal vaccine status: Declined,  Education has been provided regarding the importance of this vaccine but patient still declined. Advised may receive this vaccine at local pharmacy or Health Dept. Aware to provide a copy of the vaccination record if obtained from local pharmacy or Health Dept. Verbalized acceptance and understanding.  ? ?Covid-19 vaccine status: Completed vaccines ? ?Qualifies for Shingles Vaccine? Yes   ?Zostavax completed No   ?Shingrix Completed?: No.    Education has been provided regarding the importance of this vaccine. Patient has been advised to call insurance company to determine out of pocket expense if they have not yet received this vaccine. Advised may also receive vaccine at local pharmacy or Health Dept. Verbalized acceptance and understanding. ? ?Screening Tests ?Health Maintenance  ?Topic Date Due  ? Zoster Vaccines- Shingrix (1 of 2) Never done  ? Pneumonia Vaccine 8+ Years old (1 - PCV) Never done  ? COVID-19 Vaccine (4 - Booster for [place] series) 06/12/2020  ? INFLUENZA VACCINE  Never done  ? TETANUS/TDAP  12/12/2024  ? COLONOSCOPY (Pts 45-53yr Insurance coverage will need to be confirmed)  05/08/2028  ? Hepatitis C Screening   Completed  ? HPV VACCINES  Aged Out  ? ? ?Health Maintenance ? ?Health Maintenance Due  ?Topic Date Due  ? Zoster Vaccines- Shingrix (1 of 2) Never done  ? Pneumonia Vaccine 69 Years old (1 - PCV) Never done  ? COVID-19

## 2021-09-25 ENCOUNTER — Ambulatory Visit: Payer: Medicare HMO

## 2021-09-28 ENCOUNTER — Ambulatory Visit: Payer: Medicare HMO

## 2022-01-26 ENCOUNTER — Other Ambulatory Visit: Payer: Self-pay | Admitting: Family Medicine

## 2022-01-26 DIAGNOSIS — K219 Gastro-esophageal reflux disease without esophagitis: Secondary | ICD-10-CM

## 2022-01-28 NOTE — Telephone Encounter (Signed)
Requested medications are due for refill today.  yes  Requested medications are on the active medications list.  yes  Last refill. 08/02/2021 #90 1 refill  Future visit scheduled.   yes  Notes to clinic.  Pt has upcoming ov. PT would like to have labs pulled the same day if possible.    Requested Prescriptions  Pending Prescriptions Disp Refills   omeprazole (PRILOSEC) 20 MG capsule [Pharmacy Med Name: OMEPRAZOLE DR 20 MG CAPSULE] 90 capsule 1    Sig: TAKE 1 CAPSULE BY MOUTH EVERY DAY BEFORE BREAKFAST     Gastroenterology: Proton Pump Inhibitors Passed - 01/26/2022  9:36 AM      Passed - Valid encounter within last 12 months    Recent Outpatient Visits           1 year ago Annual physical exam   [place], DO   2 years ago Annual physical exam   [place], DO   2 years ago Acute left-sided low back pain with left-sided sciatica   [place], DO   2 years ago Acute left-sided low back pain with right-sided sciatica   [place], DO   2 years ago Close exposure to COVID-19 virus   [place], [place]       Future Appointments             In 2 weeks Parks Ranger, Devonne Doughty, DO [facility], [place]   In 1 month Ralene Bathe, MD [place]

## 2022-01-28 NOTE — Telephone Encounter (Signed)
Called pt made appt 

## 2022-02-05 ENCOUNTER — Other Ambulatory Visit: Payer: Self-pay | Admitting: Family Medicine

## 2022-02-05 DIAGNOSIS — I10 Essential (primary) hypertension: Secondary | ICD-10-CM

## 2022-02-05 NOTE — Telephone Encounter (Signed)
Requested medications are due for refill today.  yes  Requested medications are on the active medications list.  yes  Last refill. 08/15/2021 #90 1 refill  Future visit scheduled.   yes  Notes to clinic.  Labs are expired.    Requested Prescriptions  Pending Prescriptions Disp Refills   valsartan (DIOVAN) 80 MG tablet [Pharmacy Med Name: VALSARTAN 80 MG TABLET] 90 tablet 1    Sig: TAKE 1 TABLET BY MOUTH EVERY DAY     Cardiovascular:  Angiotensin Receptor Blockers Failed - 02/05/2022  2:20 AM      Failed - Cr in normal range and within 180 days    Creat  Date Value Ref Range Status  01/02/2021 0.95 0.70 - 1.25 mg/dL Final    Comment:    For patients >73 years of age, the reference limit for Creatinine is approximately 13% higher for people identified as African-American. .          Failed - K in normal range and within 180 days    Potassium  Date Value Ref Range Status  01/02/2021 4.3 3.5 - 5.3 mmol/L Final         Failed - Last BP in normal range    BP Readings from Last 1 Encounters:  09/21/21 140/78         Passed - Patient is not pregnant      Passed - Valid encounter within last 6 months    Recent Outpatient Visits           1 year ago Annual physical exam   [place], DO   2 years ago Annual physical exam   [place], DO   2 years ago Acute left-sided low back pain with left-sided sciatica   [place], DO   2 years ago Acute left-sided low back pain with right-sided sciatica   [place], DO   2 years ago Close exposure to COVID-19 virus   [place], [place]       Future Appointments             In 1 week Parks Ranger, Devonne Doughty, DO [facility], [place]   In 3 weeks Ralene Bathe, MD [place]

## 2022-02-11 ENCOUNTER — Other Ambulatory Visit: Payer: Self-pay

## 2022-02-11 DIAGNOSIS — E7800 Pure hypercholesterolemia, unspecified: Secondary | ICD-10-CM

## 2022-02-11 DIAGNOSIS — Z0000 Encounter for general adult medical examination without abnormal findings: Secondary | ICD-10-CM

## 2022-02-11 DIAGNOSIS — I10 Essential (primary) hypertension: Secondary | ICD-10-CM

## 2022-02-11 DIAGNOSIS — R7303 Prediabetes: Secondary | ICD-10-CM

## 2022-02-11 DIAGNOSIS — E669 Obesity, unspecified: Secondary | ICD-10-CM

## 2022-02-11 DIAGNOSIS — Z125 Encounter for screening for malignant neoplasm of prostate: Secondary | ICD-10-CM

## 2022-02-12 ENCOUNTER — Other Ambulatory Visit: Payer: Medicare HMO

## 2022-02-12 DIAGNOSIS — E7800 Pure hypercholesterolemia, unspecified: Secondary | ICD-10-CM

## 2022-02-12 DIAGNOSIS — I10 Essential (primary) hypertension: Secondary | ICD-10-CM

## 2022-02-12 DIAGNOSIS — E669 Obesity, unspecified: Secondary | ICD-10-CM

## 2022-02-12 DIAGNOSIS — Z125 Encounter for screening for malignant neoplasm of prostate: Secondary | ICD-10-CM

## 2022-02-12 DIAGNOSIS — Z0000 Encounter for general adult medical examination without abnormal findings: Secondary | ICD-10-CM

## 2022-02-12 DIAGNOSIS — R7303 Prediabetes: Secondary | ICD-10-CM | POA: Diagnosis not present

## 2022-02-12 DIAGNOSIS — E66811 Obesity, class 1: Secondary | ICD-10-CM

## 2022-02-13 LAB — LIPID PANEL
Cholesterol: 171 mg/dL (ref <200)
HDL: 49 mg/dL (ref >=40)
LDL Cholesterol (Calc): 104 mg/dL (calc) — ABNORMAL HIGH
Non-HDL Cholesterol (Calc): 122 mg/dL (calc) (ref <130)
Total CHOL/HDL Ratio: 3.5 (calc) (ref <5.0)
Triglycerides: 87 mg/dL (ref <150)

## 2022-02-13 LAB — TSH: TSH: 4.83 mIU/L — ABNORMAL HIGH (ref 0.40–4.50)

## 2022-02-13 LAB — COMPREHENSIVE METABOLIC PANEL
AG Ratio: 1.5 (calc) (ref 1.0–2.5)
ALT: 30 U/L (ref 9–46)
AST: 20 U/L (ref 10–35)
Albumin: 4 g/dL (ref 3.6–5.1)
Alkaline phosphatase (APISO): 61 U/L (ref 35–144)
BUN: 13 mg/dL (ref 7–25)
CO2: 27 mmol/L (ref 20–32)
Calcium: 8.9 mg/dL (ref 8.6–10.3)
Chloride: 105 mmol/L (ref 98–110)
Creat: 0.94 mg/dL (ref 0.70–1.35)
Globulin: 2.6 g/dL (calc) (ref 1.9–3.7)
Glucose, Bld: 110 mg/dL — ABNORMAL HIGH (ref 65–99)
Potassium: 4.5 mmol/L (ref 3.5–5.3)
Sodium: 141 mmol/L (ref 135–146)
Total Bilirubin: 0.5 mg/dL (ref 0.2–1.2)
Total Protein: 6.6 g/dL (ref 6.1–8.1)

## 2022-02-13 LAB — HEMOGLOBIN A1C
Hgb A1c MFr Bld: 6.1 % of total Hgb — ABNORMAL HIGH (ref <5.7)
Mean Plasma Glucose: 128 mg/dL
eAG (mmol/L): 7.1 mmol/L

## 2022-02-13 LAB — CBC WITH DIFFERENTIAL/PLATELET
Absolute Monocytes: 577 cells/uL (ref 200–950)
Basophils Absolute: 42 cells/uL (ref 0–200)
Basophils Relative: 0.8 %
Eosinophils Absolute: 130 cells/uL (ref 15–500)
Eosinophils Relative: 2.5 %
HCT: 46.5 % (ref 38.5–50.0)
Hemoglobin: 16 g/dL (ref 13.2–17.1)
Lymphs Abs: 2272 cells/uL (ref 850–3900)
MCH: 32.7 pg (ref 27.0–33.0)
MCHC: 34.4 g/dL (ref 32.0–36.0)
MCV: 94.9 fL (ref 80.0–100.0)
MPV: 10.3 fL (ref 7.5–12.5)
Monocytes Relative: 11.1 %
Neutro Abs: 2179 cells/uL (ref 1500–7800)
Neutrophils Relative %: 41.9 %
Platelets: 206 10*3/uL (ref 140–400)
RBC: 4.9 10*6/uL (ref 4.20–5.80)
RDW: 11.9 % (ref 11.0–15.0)
Total Lymphocyte: 43.7 %
WBC: 5.2 10*3/uL (ref 3.8–10.8)

## 2022-02-13 LAB — PSA: PSA: 2.99 ng/mL (ref <=4.00)

## 2022-02-14 ENCOUNTER — Ambulatory Visit (INDEPENDENT_AMBULATORY_CARE_PROVIDER_SITE_OTHER): Payer: Medicare HMO | Admitting: Family Medicine

## 2022-02-14 ENCOUNTER — Ambulatory Visit: Payer: Medicare HMO | Admitting: Dermatology

## 2022-02-14 ENCOUNTER — Other Ambulatory Visit: Payer: Self-pay | Admitting: Family Medicine

## 2022-02-14 VITALS — BP 134/80 | HR 65 | Ht 73.0 in | Wt 247.8 lb

## 2022-02-14 DIAGNOSIS — E669 Obesity, unspecified: Secondary | ICD-10-CM | POA: Diagnosis not present

## 2022-02-14 DIAGNOSIS — E66811 Obesity, class 1: Secondary | ICD-10-CM

## 2022-02-14 DIAGNOSIS — N528 Other male erectile dysfunction: Secondary | ICD-10-CM | POA: Diagnosis not present

## 2022-02-14 DIAGNOSIS — R7303 Prediabetes: Secondary | ICD-10-CM

## 2022-02-14 DIAGNOSIS — Z8619 Personal history of other infectious and parasitic diseases: Secondary | ICD-10-CM

## 2022-02-14 DIAGNOSIS — I10 Essential (primary) hypertension: Secondary | ICD-10-CM

## 2022-02-14 DIAGNOSIS — Z0000 Encounter for general adult medical examination without abnormal findings: Secondary | ICD-10-CM

## 2022-02-14 DIAGNOSIS — E7800 Pure hypercholesterolemia, unspecified: Secondary | ICD-10-CM

## 2022-02-14 DIAGNOSIS — R972 Elevated prostate specific antigen [PSA]: Secondary | ICD-10-CM

## 2022-02-14 DIAGNOSIS — Z125 Encounter for screening for malignant neoplasm of prostate: Secondary | ICD-10-CM

## 2022-02-14 DIAGNOSIS — R7989 Other specified abnormal findings of blood chemistry: Secondary | ICD-10-CM

## 2022-02-14 MED ORDER — SILDENAFIL CITRATE 20 MG PO TABS: ORAL_TABLET | ORAL | 11 refills | Status: DC

## 2022-02-14 NOTE — Assessment & Plan Note (Signed)
A1c up to 6.1, slight increase With HLD and HTN, fairly well controlled overall Wt stable  Plan:  1. Not on any therapy currently  2. Encourage improved lifestyle - low carb, low sugar diet, reduce portion size, continue improving regular exercise

## 2022-02-14 NOTE — Assessment & Plan Note (Signed)
Improved LDL to 104  The 10-year ASCVD risk score (Arnett DK, et al., 2019) is: 18.2%   Plan: 1. Reviewed ASCVD risk and offered statin therapy - patient declines again at this time, prefers to only take ASA for now and improve lifestyle, understands counseling on benefits and risk reduction of statin 2. Continue ASA '81mg'$  for primary ASCVD risk reduction 3. Encourage improved lifestyle - low carb/cholesterol, reduce portion size, continue improving regular exercise 4. Follow-up yearly lipids

## 2022-02-14 NOTE — Assessment & Plan Note (Signed)
Controlled BP Home reviewed No known complications  Failed Lisinopril (ACEi cough)    Plan:  1. Continue Valsartan '80mg'$  daily 2. Encourage improved lifestyle - low sodium diet, regular exercise 3. Continue monitor BP outside office, bring readings to next visit, if persistently >140/90 or new symptoms notify office sooner

## 2022-02-14 NOTE — Patient Instructions (Addendum)
Thank you for coming to the office today.  Labs look good overall.  Keep an eye on BP readings, goal < 140 / 90  Meds refilled  Sildenafil to [facility].  You most likely have a Right knee sprain - this is an injury to the ligaments supporting the knee, it can be a minor injury or small tear, rarely it can turn out to be a larger tear to the ligament if it does not improve, we cannot rule this out.   Also I am concerned about the possibility of a meniscus injury (the cartilage shock absorber within the knee)  No X-ray today we can reconsider in future  - I recommend a Knee Brace to support your knee and limit motion of the knee to help it heal, avoid excess weight bearing and repetitive activities on it  START anti inflammatory topical - OTC Voltaren (generic Diclofenac) topical 2-4 times a day as needed for pain swelling of affected joint for 1-2 weeks or longer.  Use RICE therapy: - R - Rest / relative rest with activity modification avoid overuse and frequent bending or pressure on bent knee - I - Ice packs (make sure you use a towel or sock / something to protect skin) - C - Compression with ACE wrap or immobilizer apply pressure and reduce swelling allowing more support - E - Elevation - if significant swelling, lift leg above heart level (toes above your nose) to help reduce swelling, most helpful at night after day of being on your feet    DUE for FASTING BLOOD WORK (no food or drink after midnight before the lab appointment, only water or coffee without cream/sugar on the morning of)  SCHEDULE "Lab Only" visit in the morning at the clinic for lab draw in 1 YEAR  - Make sure Lab Only appointment is at about 1 week before your next appointment, so that results will be available  For Lab Results, once available within 2-3 days of blood draw, you can can log in to MyChart online to view your results and a brief explanation. Also, we can discuss results at next follow-up  visit.   Please schedule a Follow-up Appointment to: Return in about 1 year (around 02/15/2023) for 1 year fasting lab only then 1 week later Annual Physical.  If you have any other questions or concerns, please feel free to call the office or send a message through [place]. You may also schedule an earlier appointment if necessary.  Additionally, you may be receiving a survey about your experience at our office within a few days to 1 week by e-mail or mail. We value your feedback.  Nobie Putnam, DO [place]

## 2022-02-14 NOTE — Assessment & Plan Note (Signed)
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

## 2022-02-14 NOTE — Progress Notes (Signed)
Subjective:    Patient ID: Zachary Wall, male    DOB: Nov 19, 1950, 71 y.o.   MRN: [id number]  Zachary Wall is a 71 y.o. male presenting on 02/14/2022 for Annual Exam   HPI  Here for Annual Physical and Lab Review   Pre-Diabetes / OBESITY BMI >32 Last Lab A1c 6.1, previously 5.8 to 6.0 No new concerns today. Lifestyle: - Weight stable - Diet: stable diet, limiting carbs and sweets, drinks mostly water, 1-2 diet drinks daily - Exercise: not as active due to heat. Denies hypoglycemia, polyuria, visual changes, numbness or tingling.   CHRONIC HTN: Home BP normal readings and some mild elevated. No severe range. He check weekly. Current Meds - Valsartan '80mg'$  daily Reports good compliance, took meds today. Tolerating well, w/o complaints. Lifestyle: (see below) Denies CP, dyspnea, HA, edema, dizziness / lightheadedness   Erectile Dysfunction Stable chronic problem. He takes Sildenafil up to 3 pills '20mg'$  each PRN with good results, request refill today   HYPERLIPIDEMIA: - Reports no concerns. Last lipid panel 01/2022 improved LDL 126 down to 104 - Currently not on Statin. Not interested. - Taking ASA EC '81mg'$  daily since last visit, tolerating well - he does admit some mild easy bleeding, but doing fine - No known CVD or MI in family history       Health Maintenance:    Prostate CA Screening: Previously followed by [facility] due to elevated PSA in past. Recent results were range of 1 to 3. Last PSA 2.99 (01/2022) previously 1.7 and prior 2.5, seems to fluctuate within the range Currently asymptomatic without LUTS. No known family history of prostate CA   UTD colonoscopy - last 2016, per Dr Gaylyn Cheers - scanned into chart  DUE 5 year, now tubular adenoma, x 1 Repeat Colonoscopy 05/08/21, next due 2029   UTD Hep C and HIV routine screening, negative 03/2017   Due for - Shingrix. Unsure if he had Chicken Pox in past was told he did not have rash but was exposed.      02/14/2022   10:06 AM 09/21/2021    8:57 AM 01/09/2021    2:19 PM  Depression screen PHQ 2/9  Decreased Interest 0 0 0  Down, Depressed, Hopeless 0 0 0  PHQ - 2 Score 0 0 0  Altered sleeping 0  0  Tired, decreased energy 0  0  Change in appetite 0  0  Feeling bad or failure about yourself  0  0  Trouble concentrating 0  0  Moving slowly or fidgety/restless 0  0  Suicidal thoughts 0  0  PHQ-9 Score 0  0  Difficult doing work/chores Not difficult at all  Not difficult at all    Past Medical History:  Diagnosis Date   Actinic keratosis    Cancer ([place])    skin   Hypertension    SCC (squamous cell carcinoma) 02/05/2021   R volar forearm, EDC   Squamous cell carcinoma of skin 06/17/2016   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   Squamous cell carcinoma of skin 05/02/2021   R distal calf - ED&C   Past Surgical History:  Procedure Laterality Date   COLONOSCOPY WITH PROPOFOL     COLONOSCOPY WITH PROPOFOL N/A 05/08/2021   Procedure: COLONOSCOPY WITH PROPOFOL;  Surgeon: Jonathon Bellows, MD;  Location[facility] ENDOSCOPY;  Service: Gastroenterology;  Laterality: N/A;   SKIN CANCER EXCISION     Social History   Socioeconomic History   Marital  status: Married    Spouse name: Not on file   Number of children: Not on file   Years of education: Not on file   Highest education level: Not on file  Occupational History   Not on file  Tobacco Use   Smoking status: Never   Smokeless tobacco: Never  Substance and Sexual Activity   Alcohol use: No   Drug use: No   Sexual activity: Yes  Other Topics Concern   Not on file  Social History Narrative   Not on file   Social Determinants of Health   Financial Resource Strain: Low Risk  (09/21/2021)   Overall Financial Resource Strain (CARDIA)    Difficulty of Paying Living Expenses: Not hard at all  Food Insecurity: No Food Insecurity (09/21/2021)   Hunger Vital Sign    Worried About Running Out of Food in the Last Year: Never true    Ran Out of  Food in the Last Year: Never true  Transportation Needs: No Transportation Needs (09/21/2021)   PRAPARE - Hydrologist (Medical): No    Lack of Transportation (Non-Medical): No  Physical Activity: Sufficiently Active (09/21/2021)   Exercise Vital Sign    Days of Exercise per Week: 6 days    Minutes of Exercise per Session: 40 min  Stress: No Stress Concern Present (09/21/2021)   [place]    Feeling of Stress : Not at all  Social Connections: Moderately Integrated (09/21/2021)   Social Connection and Isolation Panel [NHANES]    Frequency of Communication with Friends and Family: Twice a week    Frequency of Social Gatherings with Friends and Family: Three times a week    Attends Religious Services: More than 4 times per year    Active Member of Clubs or Organizations: No    Attends Archivist Meetings: Never    Marital Status: Married  Human resources officer Violence: Not At Risk (09/21/2021)   Humiliation, Afraid, Rape, and Kick questionnaire    Fear of Current or Ex-Partner: No    Emotionally Abused: No    Physically Abused: No    Sexually Abused: No   Family History  Problem Relation Age of Onset   Hematuria Neg Hx    Prostate cancer Neg Hx    Tuberculosis Neg Hx    Kidney cancer Neg Hx    Bladder Cancer Neg Hx    Current Outpatient Medications on File Prior to Visit  Medication Sig   acetaminophen (TYLENOL) 500 MG chewable tablet Chew 500 mg by mouth every 6 (six) hours as needed for pain.   aspirin EC 81 MG tablet Take 1 tablet (81 mg total) daily by mouth.   omeprazole (PRILOSEC) 20 MG capsule TAKE 1 CAPSULE BY MOUTH EVERY DAY BEFORE BREAKFAST   Tavaborole (KERYDIN) 5 % SOLN Apply 1 application topically at bedtime. Qhs to toenails   valsartan (DIOVAN) 80 MG tablet TAKE 1 TABLET BY MOUTH EVERY DAY   polyethylene glycol-electrolytes (NULYTELY) 420 g solution PLEASE SEE ATTACHED  FOR DETAILED DIRECTIONS (Patient not taking: Reported on 09/21/2021)   No current facility-administered medications on file prior to visit.    Review of Systems  Constitutional:  Negative for activity change, appetite change, chills, diaphoresis, fatigue and fever.  HENT:  Negative for congestion and hearing loss.   Eyes:  Negative for visual disturbance.  Respiratory:  Negative for cough, chest tightness, shortness of breath and wheezing.  Cardiovascular:  Negative for chest pain, palpitations and leg swelling.  Gastrointestinal:  Negative for abdominal pain, constipation, diarrhea, nausea and vomiting.  Genitourinary:  Negative for dysuria, frequency and hematuria.  Musculoskeletal:  Positive for arthralgias. Negative for neck pain.  Skin:  Negative for rash.  Neurological:  Negative for dizziness, weakness, light-headedness, numbness and headaches.  Hematological:  Negative for adenopathy.  Psychiatric/Behavioral:  Negative for behavioral problems, dysphoric mood and sleep disturbance.    Per HPI unless specifically indicated above      Objective:    BP 134/80 (BP Location: Left Arm, Cuff Size: Normal)   Pulse 65   Ht '6\' 1"'$  (1.854 m)   Wt 247 lb 12.8 oz (112.4 kg)   SpO2 98%   BMI 32.69 kg/m   Wt Readings from Last 3 Encounters:  02/14/22 247 lb 12.8 oz (112.4 kg)  09/21/21 242 lb (109.8 kg)  05/08/21 235 lb (106.6 kg)    Physical Exam Vitals and nursing note reviewed.  Constitutional:      General: He is not in acute distress.    Appearance: He is well-developed. He is not diaphoretic.     Comments: Well-appearing, comfortable, cooperative  HENT:     Head: Normocephalic and atraumatic.  Eyes:     General:        Right eye: No discharge.        Left eye: No discharge.     Conjunctiva/sclera: Conjunctivae normal.     Pupils: Pupils are equal, round, and reactive to light.  Neck:     Thyroid: No thyromegaly.  Cardiovascular:     Rate and Rhythm: Normal rate and  regular rhythm.     Pulses: Normal pulses.     Heart sounds: Normal heart sounds. No murmur heard. Pulmonary:     Effort: Pulmonary effort is normal. No respiratory distress.     Breath sounds: Normal breath sounds. No wheezing or rales.  Abdominal:     General: Bowel sounds are normal. There is no distension.     Palpations: Abdomen is soft. There is no mass.     Tenderness: There is no abdominal tenderness.  Musculoskeletal:        General: No tenderness. Normal range of motion.     Cervical back: Normal range of motion and neck supple.     Comments: Upper / Lower Extremities: - Normal muscle tone, strength bilateral upper extremities 5/5, lower extremities 5/5  Right knee Inspection: slightly bulky appearance mild effusion Palpation: Slight tender medial joint line. Bileral crepitus ROM: Full active ROM bilaterally Special Testing: Lachman / Valgus/Varus tests negative with intact ligaments (ACL, MCL, LCL). McMurray negative without meniscus symptoms. Strength: 5/5 intact knee flex/ext, ankle dorsi/plantarflex Neurovascular: distally intact sensation light touch and pulses   Lymphadenopathy:     Cervical: No cervical adenopathy.  Skin:    General: Skin is warm and dry.     Findings: No erythema or rash.  Neurological:     Mental Status: He is alert and oriented to person, place, and time.     Comments: Distal sensation intact to light touch all extremities  Psychiatric:        Mood and Affect: Mood normal.        Behavior: Behavior normal.        Thought Content: Thought content normal.     Comments: Well groomed, good eye contact, normal speech and thoughts      Results for orders placed or performed in visit on 02/12/22  HgB A1c  Result Value Ref Range   Hgb A1c MFr Bld 6.1 (H) <5.7 % of total Hgb   Mean Plasma Glucose 128 mg/dL   eAG (mmol/L) 7.1 mmol/L  TSH  Result Value Ref Range   TSH 4.83 (H) 0.40 - 4.50 mIU/L  PSA  Result Value Ref Range   PSA 2.99 < OR  = 4.00 ng/mL  Lipid panel  Result Value Ref Range   Cholesterol 171 <200 mg/dL   HDL 49 > OR = 40 mg/dL   Triglycerides 87 <150 mg/dL   LDL Cholesterol (Calc) 104 (H) mg/dL (calc)   Total CHOL/HDL Ratio 3.5 <5.0 (calc)   Non-HDL Cholesterol (Calc) 122 <130 mg/dL (calc)  Comprehensive Metabolic Panel (CMET)  Result Value Ref Range   Glucose, Bld 110 (H) 65 - 99 mg/dL   BUN 13 7 - 25 mg/dL   Creat 0.94 0.70 - 1.35 mg/dL   BUN/Creatinine Ratio SEE NOTE: 6 - 22 (calc)   Sodium 141 135 - 146 mmol/L   Potassium 4.5 3.5 - 5.3 mmol/L   Chloride 105 98 - 110 mmol/L   CO2 27 20 - 32 mmol/L   Calcium 8.9 8.6 - 10.3 mg/dL   Total Protein 6.6 6.1 - 8.1 g/dL   Albumin 4.0 3.6 - 5.1 g/dL   Globulin 2.6 1.9 - 3.7 g/dL (calc)   AG Ratio 1.5 1.0 - 2.5 (calc)   Total Bilirubin 0.5 0.2 - 1.2 mg/dL   Alkaline phosphatase (APISO) 61 35 - 144 U/L   AST 20 10 - 35 U/L   ALT 30 9 - 46 U/L  CBC with Differential  Result Value Ref Range   WBC 5.2 3.8 - 10.8 Thousand/uL   RBC 4.90 4.20 - 5.80 Million/uL   Hemoglobin 16.0 13.2 - 17.1 g/dL   HCT 46.5 38.5 - 50.0 %   MCV 94.9 80.0 - 100.0 fL   MCH 32.7 27.0 - 33.0 pg   MCHC 34.4 32.0 - 36.0 g/dL   RDW 11.9 11.0 - 15.0 %   Platelets 206 140 - 400 Thousand/uL   MPV 10.3 7.5 - 12.5 fL   Neutro Abs 2,179 1,500 - 7,800 cells/uL   Lymphs Abs 2,272 850 - 3,900 cells/uL   Absolute Monocytes 577 200 - 950 cells/uL   Eosinophils Absolute 130 15 - 500 cells/uL   Basophils Absolute 42 0 - 200 cells/uL   Neutrophils Relative % 41.9 %   Total Lymphocyte 43.7 %   Monocytes Relative 11.1 %   Eosinophils Relative 2.5 %   Basophils Relative 0.8 %      Assessment & Plan:   Problem List Items Addressed This Visit     Essential hypertension    Controlled BP Home reviewed No known complications  Failed Lisinopril (ACEi cough)    Plan:  1. Continue Valsartan '80mg'$  daily 2. Encourage improved lifestyle - low sodium diet, regular exercise 3. Continue  monitor BP outside office, bring readings to next visit, if persistently >140/90 or new symptoms notify office sooner      Relevant Medications   sildenafil (REVATIO) 20 MG tablet   Hyperlipidemia    Improved LDL to 104  The 10-year ASCVD risk score (Arnett DK, et al., 2019) is: 18.2%   Plan: 1. Reviewed ASCVD risk and offered statin therapy - patient declines again at this time, prefers to only take ASA for now and improve lifestyle, understands counseling on benefits and risk reduction of statin 2. Continue ASA '81mg'$  for primary  ASCVD risk reduction 3. Encourage improved lifestyle - low carb/cholesterol, reduce portion size, continue improving regular exercise 4. Follow-up yearly lipids      Relevant Medications   sildenafil (REVATIO) 20 MG tablet   Obesity (BMI 30.0-34.9)   Other male erectile dysfunction    Controlled on PDE5 Refill Sildenafil, goodrx      Relevant Medications   sildenafil (REVATIO) 20 MG tablet   Pre-diabetes    A1c up to 6.1, slight increase With HLD and HTN, fairly well controlled overall Wt stable  Plan:  1. Not on any therapy currently  2. Encourage improved lifestyle - low carb, low sugar diet, reduce portion size, continue improving regular exercise      Other Visit Diagnoses     Annual physical exam    -  Primary       Updated Health Maintenance information Reviewed recent lab results with patient Encouraged improvement to lifestyle with diet and exercise Goal of weight loss  ED Rx Sildenafil, goodrx printed  R Knee likely medial sprain Possible meniscus, exam reassuring Conservative care reviewed Brace, topical voltaren, RICE therapy, activity modify Follow up in future consider X-ray and other management   Meds ordered this encounter  Medications   sildenafil (REVATIO) 20 MG tablet    Sig: Take 3-5 pills about 30 min prior to sex    Dispense:  30 tablet    Refill:  11    goodrx    Follow up plan: Return in about 1  year (around 02/15/2023) for 1 year fasting lab only then 1 week later Annual Physical.  Future add Shingles lab titer, add TSH, T4 add other testing., labs all ordered for 02/11/23  Nobie Putnam, [facility] 02/14/2022, 10:27 AM

## 2022-02-27 ENCOUNTER — Encounter: Payer: Self-pay | Admitting: Dermatology

## 2022-02-27 ENCOUNTER — Ambulatory Visit: Payer: Medicare HMO | Admitting: Dermatology

## 2022-02-27 DIAGNOSIS — C44622 Squamous cell carcinoma of skin of right upper limb, including shoulder: Secondary | ICD-10-CM

## 2022-02-27 DIAGNOSIS — L570 Actinic keratosis: Secondary | ICD-10-CM

## 2022-02-27 DIAGNOSIS — C44729 Squamous cell carcinoma of skin of left lower limb, including hip: Secondary | ICD-10-CM

## 2022-02-27 DIAGNOSIS — L578 Other skin changes due to chronic exposure to nonionizing radiation: Secondary | ICD-10-CM

## 2022-02-27 DIAGNOSIS — D492 Neoplasm of unspecified behavior of bone, soft tissue, and skin: Secondary | ICD-10-CM

## 2022-02-27 DIAGNOSIS — L821 Other seborrheic keratosis: Secondary | ICD-10-CM

## 2022-02-27 NOTE — Progress Notes (Signed)
Follow-Up Visit   Subjective  Zachary Wall is a 71 y.o. male who presents for the following: Actinic Keratosis (Hands, face, ears) and check spot (L calf, 3--4 wks, tender to touch). The patient has spots, moles and lesions to be evaluated, some may be new or changing and the patient has concerns that these could be cancer.  The following portions of the chart were reviewed this encounter and updated as appropriate:   Tobacco  Allergies  Meds  Problems  Med Hx  Surg Hx  Fam Hx     Review of Systems:  No other skin or systemic complaints except as noted in HPI or Assessment and Plan.  Objective  Well appearing patient in no apparent distress; mood and affect are within normal limits.  A focused examination was performed including face, scalp, ears, left calf. Relevant physical exam findings are noted in the Assessment and Plan.  L proximal calf inferior 1.1 keratotic pap     L proximal calf superior 0.6cm keratotic pap     Right Hand - Posterior 0.7cm keratotic pap     face x 2, bil arms/hands x 9 (11) Pink scaly macules   Assessment & Plan   Actinic Damage - chronic, secondary to cumulative UV radiation exposure/sun exposure over time - diffuse scaly erythematous macules with underlying dyspigmentation - Recommend daily broad spectrum sunscreen SPF 30+ to sun-exposed areas, reapply every 2 hours as needed.  - Recommend staying in the shade or wearing long sleeves, sun glasses (UVA+UVB protection) and wide brim hats (4-inch brim around the entire circumference of the hat). - Call for new or changing lesions.   Neoplasm of skin (3) L proximal calf inferior Epidermal / dermal shaving  Lesion diameter (cm):  1.1 Informed consent: discussed and consent obtained   Timeout: patient name, date of birth, surgical site, and procedure verified   Procedure prep:  Patient was prepped and draped in usual sterile fashion Prep type:  Isopropyl alcohol Anesthesia:  the lesion was anesthetized in a standard fashion   Anesthetic:  1% lidocaine w/ epinephrine 1-100,000 buffered w/ 8.4% NaHCO3 Instrument used: flexible razor blade   Hemostasis achieved with: pressure, aluminum chloride and electrodesiccation   Outcome: patient tolerated procedure well   Post-procedure details: sterile dressing applied and wound care instructions given   Dressing type: bandage and bacitracin    Destruction of lesion Complexity: extensive   Destruction method: electrodesiccation and curettage   Informed consent: discussed and consent obtained   Timeout:  patient name, date of birth, surgical site, and procedure verified Procedure prep:  Patient was prepped and draped in usual sterile fashion Prep type:  Isopropyl alcohol Anesthesia: the lesion was anesthetized in a standard fashion   Anesthetic:  1% lidocaine w/ epinephrine 1-100,000 buffered w/ 8.4% NaHCO3 Curettage performed in three different directions: Yes   Electrodesiccation performed over the curetted area: Yes   Lesion length (cm):  1.1 Lesion width (cm):  1.1 Margin per side (cm):  0.2 Final wound size (cm):  1.5 Hemostasis achieved with:  pressure, aluminum chloride and electrodesiccation Outcome: patient tolerated procedure well with no complications   Post-procedure details: sterile dressing applied and wound care instructions given   Dressing type: bandage and bacitracin    Specimen 1 - Surgical pathology Differential Diagnosis: D48.5 R/O SCC Check Margins: No 1.1 keratotic pap EDC  L proximal calf superior Epidermal / dermal shaving  Lesion diameter (cm):  0.6 Informed consent: discussed and consent obtained   Timeout:  patient name, date of birth, surgical site, and procedure verified   Procedure prep:  Patient was prepped and draped in usual sterile fashion Prep type:  Isopropyl alcohol Anesthesia: the lesion was anesthetized in a standard fashion   Anesthetic:  1% lidocaine w/ epinephrine  1-100,000 buffered w/ 8.4% NaHCO3 Instrument used: flexible razor blade   Hemostasis achieved with: pressure, aluminum chloride and electrodesiccation   Outcome: patient tolerated procedure well   Post-procedure details: sterile dressing applied and wound care instructions given   Dressing type: bandage and bacitracin    Destruction of lesion Complexity: extensive   Destruction method: electrodesiccation and curettage   Informed consent: discussed and consent obtained   Timeout:  patient name, date of birth, surgical site, and procedure verified Procedure prep:  Patient was prepped and draped in usual sterile fashion Prep type:  Isopropyl alcohol Anesthesia: the lesion was anesthetized in a standard fashion   Anesthetic:  1% lidocaine w/ epinephrine 1-100,000 buffered w/ 8.4% NaHCO3 Curettage performed in three different directions: Yes   Electrodesiccation performed over the curetted area: Yes   Lesion length (cm):  0.6 Lesion width (cm):  0.6 Margin per side (cm):  0.2 Final wound size (cm):  1 Hemostasis achieved with:  pressure, aluminum chloride and electrodesiccation Outcome: patient tolerated procedure well with no complications   Post-procedure details: sterile dressing applied and wound care instructions given   Dressing type: bandage and bacitracin    Specimen 2 - Surgical pathology Differential Diagnosis: D48.5 R/O SCC Check Margins: No 0.6cm keratotic pap EDC  Right Hand - Posterior Epidermal / dermal shaving  Lesion diameter (cm):  0.7 Informed consent: discussed and consent obtained   Timeout: patient name, date of birth, surgical site, and procedure verified   Procedure prep:  Patient was prepped and draped in usual sterile fashion Prep type:  Isopropyl alcohol Anesthesia: the lesion was anesthetized in a standard fashion   Anesthetic:  1% lidocaine w/ epinephrine 1-100,000 buffered w/ 8.4% NaHCO3 Instrument used: flexible razor blade   Hemostasis achieved  with: pressure, aluminum chloride and electrodesiccation   Outcome: patient tolerated procedure well   Post-procedure details: sterile dressing applied and wound care instructions given   Dressing type: bandage and bacitracin    Destruction of lesion Complexity: extensive   Destruction method: electrodesiccation and curettage   Informed consent: discussed and consent obtained   Timeout:  patient name, date of birth, surgical site, and procedure verified Procedure prep:  Patient was prepped and draped in usual sterile fashion Prep type:  Isopropyl alcohol Anesthesia: the lesion was anesthetized in a standard fashion   Anesthetic:  1% lidocaine w/ epinephrine 1-100,000 buffered w/ 8.4% NaHCO3 Curettage performed in three different directions: Yes   Electrodesiccation performed over the curetted area: Yes   Lesion length (cm):  0.7 Lesion width (cm):  0.7 Margin per side (cm):  0.2 Final wound size (cm):  1.1 Hemostasis achieved with:  pressure, aluminum chloride and electrodesiccation Outcome: patient tolerated procedure well with no complications   Post-procedure details: sterile dressing applied and wound care instructions given   Dressing type: bandage and bacitracin    Specimen 3 - Surgical pathology Differential Diagnosis:  D48.5 R/O SCC  Check Margins: No 0.7cm keratotic pap EDC  AK (actinic keratosis) (11) face x 2, bil arms/hands x 9 Destruction of lesion - face x 2, bil arms/hands x 9 Complexity: simple   Destruction method: cryotherapy   Informed consent: discussed and consent obtained   Timeout:  patient name,  date of birth, surgical site, and procedure verified Lesion destroyed using liquid nitrogen: Yes   Region frozen until ice ball extended beyond lesion: Yes   Outcome: patient tolerated procedure well with no complications   Post-procedure details: wound care instructions given    Actinic skin damage  Seborrheic keratosis  Seborrheic Keratoses -  Stuck-on, waxy, tan-brown papules and/or plaques  - Benign-appearing - Discussed benign etiology and prognosis. - Observe - Call for any changes  Return in about 6 months (around 08/29/2022) for TBSE, Hx of SCC, SCCIS, AKs.  I, Sonya Hupman, RMA, am acting as scribe for Sarina Ser, MD . Documentation: I have reviewed the above documentation for accuracy and completeness, and I agree with the above.  Sarina Ser, MD

## 2022-02-27 NOTE — Patient Instructions (Addendum)
Wound Care Instructions  Cleanse wound gently with soap and water once a day then pat dry with clean gauze. Apply a thin coat of Petrolatum (petroleum jelly, "Vaseline") over the wound (unless you have an allergy to this). We recommend that you use a new, sterile tube of Vaseline. Do not pick or remove scabs. Do not remove the yellow or white "healing tissue" from the base of the wound.  Cover the wound with fresh, clean, nonstick gauze and secure with paper tape. You may use Band-Aids in place of gauze and tape if the wound is small enough, but would recommend trimming much of the tape off as there is often too much. Sometimes Band-Aids can irritate the skin.  You should call the office for your biopsy report after 1 week if you have not already been contacted.  If you experience any problems, such as abnormal amounts of bleeding, swelling, significant bruising, significant pain, or evidence of infection, please call the office immediately.  FOR ADULT SURGERY PATIENTS: If you need something for pain relief you may take 1 extra strength Tylenol (acetaminophen) AND 2 Ibuprofen (200mg each) together every 4 hours as needed for pain. (do not take these if you are allergic to them or if you have a reason you should not take them.) Typically, you may only need pain medication for 1 to 3 days.     Cryotherapy Aftercare  Wash gently with soap and water everyday.   Apply Vaseline and Band-Aid daily until healed.    Due to recent changes in healthcare laws, you may see results of your pathology and/or laboratory studies on MyChart before the doctors have had a chance to review them. We understand that in some cases there may be results that are confusing or concerning to you. Please understand that not all results are received at the same time and often the doctors may need to interpret multiple results in order to provide you with the best plan of care or course of treatment. Therefore, we ask that you  please give us 2 business days to thoroughly review all your results before contacting the office for clarification. Should we see a critical lab result, you will be contacted sooner.   If You Need Anything After Your Visit  If you have any questions or concerns for your doctor, please call our main line at [phone] and press option 4 to reach your doctor's medical assistant. If no one answers, please leave a voicemail as directed and we will return your call as soon as possible. Messages left after 4 pm will be answered the following business day.   You may also send us a message via MyChart. We typically respond to MyChart messages within 1-2 business days.  For prescription refills, please ask your pharmacy to contact our office. Our fax number is [phone].  If you have an urgent issue when the clinic is closed that cannot wait until the next business day, you can page your doctor at the number below.    Please note that while we do our best to be available for urgent issues outside of office hours, we are not available 24/7.   If you have an urgent issue and are unable to reach us, you may choose to seek medical care at your doctor's office, retail clinic, urgent care center, or emergency room.  If you have a medical emergency, please immediately call 911 or go to the emergency department.  Pager Numbers  - Dr. Kowalski: [phone]  -   Dr. Moye: [phone]  - Dr. Stewart: [phone]  In the event of inclement weather, please call our main line at [phone] for an update on the status of any delays or closures.  Dermatology Medication Tips: Please keep the boxes that topical medications come in in order to help keep track of the instructions about where and how to use these. Pharmacies typically print the medication instructions only on the boxes and not directly on the medication tubes.   If your medication is too expensive, please contact our office at  [phone] option 4 or send us a message through MyChart.   We are unable to tell what your co-pay for medications will be in advance as this is different depending on your insurance coverage. However, we may be able to find a substitute medication at lower cost or fill out paperwork to get insurance to cover a needed medication.   If a prior authorization is required to get your medication covered by your insurance company, please allow us 1-2 business days to complete this process.  Drug prices often vary depending on where the prescription is filled and some pharmacies may offer cheaper prices.  The website www.goodrx.com contains coupons for medications through different pharmacies. The prices here do not account for what the cost may be with help from insurance (it may be cheaper with your insurance), but the website can give you the price if you did not use any insurance.  - You can print the associated coupon and take it with your prescription to the pharmacy.  - You may also stop by our office during regular business hours and pick up a GoodRx coupon card.  - If you need your prescription sent electronically to a different pharmacy, notify our office through [facility] MyChart or by phone at [phone] option 4.     Si Usted Necesita Algo Despus de Su Visita  Tambin puede enviarnos un mensaje a travs de MyChart. Por lo general respondemos a los mensajes de MyChart en el transcurso de 1 a 2 das hbiles.  Para renovar recetas, por favor pida a su farmacia que se ponga en contacto con nuestra oficina. Nuestro nmero de fax es el [phone].  Si tiene un asunto urgente cuando la clnica est cerrada y que no puede esperar hasta el siguiente da hbil, puede llamar/localizar a su doctor(a) al nmero que aparece a continuacin.   Por favor, tenga en cuenta que aunque hacemos todo lo posible para estar disponibles para asuntos urgentes fuera del horario de oficina, no estamos  disponibles las 24 horas del da, los 7 das de la semana.   Si tiene un problema urgente y no puede comunicarse con nosotros, puede optar por buscar atencin mdica  en el consultorio de su doctor(a), en una clnica privada, en un centro de atencin urgente o en una sala de emergencias.  Si tiene una emergencia mdica, por favor llame inmediatamente al 911 o vaya a la sala de emergencias.  Nmeros de bper  - Dr. Kowalski: [phone]  - Dra. Moye: [phone]  - Dra. Stewart: [phone]  En caso de inclemencias del tiempo, por favor llame a nuestra lnea principal al [phone] para una actualizacin sobre el estado de cualquier retraso o cierre.  Consejos para la medicacin en dermatologa: Por favor, guarde las cajas en las que vienen los medicamentos de uso tpico para ayudarle a seguir las instrucciones sobre dnde y cmo usarlos. Las farmacias generalmente imprimen las instrucciones del medicamento slo en las cajas y   no directamente en los tubos del medicamento.   Si su medicamento es muy caro, por favor, pngase en contacto con nuestra oficina llamando al [phone] y presione la opcin 4 o envenos un mensaje a travs de MyChart.   No podemos decirle cul ser su copago por los medicamentos por adelantado ya que esto es diferente dependiendo de la cobertura de su seguro. Sin embargo, es posible que podamos encontrar un medicamento sustituto a menor costo o llenar un formulario para que el seguro cubra el medicamento que se considera necesario.   Si se requiere una autorizacin previa para que su compaa de seguros cubra su medicamento, por favor permtanos de 1 a 2 das hbiles para completar este proceso.  Los precios de los medicamentos varan con frecuencia dependiendo del lugar de dnde se surte la receta y alguna farmacias pueden ofrecer precios ms baratos.  El sitio web www.goodrx.com tiene cupones para medicamentos de diferentes farmacias. Los precios aqu no  tienen en cuenta lo que podra costar con la ayuda del seguro (puede ser ms barato con su seguro), pero el sitio web puede darle el precio si no utiliz ningn seguro.  - Puede imprimir el cupn correspondiente y llevarlo con su receta a la farmacia.  - Tambin puede pasar por nuestra oficina durante el horario de atencin regular y recoger una tarjeta de cupones de GoodRx.  - Si necesita que su receta se enve electrnicamente a una farmacia diferente, informe a nuestra oficina a travs de MyChart de [facility] o por telfono llamando al [phone] y presione la opcin 4.  

## 2022-03-07 ENCOUNTER — Telehealth: Payer: Self-pay

## 2022-03-07 NOTE — Telephone Encounter (Signed)
-----   Message from Ralene Bathe, MD sent at 03/06/2022  6:14 PM EDT ----- Diagnosis 1. 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 Skin , left proximal calf superior WELL DIFFERENTIATED SQUAMOUS CELL CARCINOMA, BASE INVOLVED 3. 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

## 2022-03-07 NOTE — Telephone Encounter (Signed)
Patient informed of pathology results 

## 2022-08-02 ENCOUNTER — Other Ambulatory Visit: Payer: Self-pay | Admitting: Family Medicine

## 2022-08-02 DIAGNOSIS — I10 Essential (primary) hypertension: Secondary | ICD-10-CM

## 2022-08-02 NOTE — Telephone Encounter (Signed)
Requested Prescriptions  Pending Prescriptions Disp Refills   valsartan (DIOVAN) 80 MG tablet [Pharmacy Med Name: VALSARTAN 80 MG TABLET] 90 tablet 0    Sig: TAKE 1 TABLET BY MOUTH EVERY DAY     Cardiovascular:  Angiotensin Receptor Blockers Passed - 08/02/2022  1:32 AM      Passed - Cr in normal range and within 180 days    Creat  Date Value Ref Range Status  02/12/2022 0.94 0.70 - 1.35 mg/dL Final         Passed - K in normal range and within 180 days    Potassium  Date Value Ref Range Status  02/12/2022 4.5 3.5 - 5.3 mmol/L Final         Passed - Patient is not pregnant      Passed - Last BP in normal range    BP Readings from Last 1 Encounters:  02/14/22 134/80         Passed - Valid encounter within last 6 months    Recent Outpatient Visits           5 months ago Annual physical exam   [facility] Olin Hauser, DO   1 year ago Annual physical exam   [facility] Olin Hauser, DO   2 years ago Annual physical exam   [facility] [place], Devonne Doughty, DO   2 years ago Acute left-sided low back pain with left-sided sciatica   [place], DO   2 years ago Acute left-sided low back pain with right-sided sciatica   [place], DO       Future Appointments             In 1 month Nehemiah Massed Monia Sabal, MD [place]   In 6 months [place], Devonne Doughty, [facility], [facility]

## 2022-09-05 ENCOUNTER — Ambulatory Visit: Payer: Medicare HMO | Admitting: Dermatology

## 2022-09-05 ENCOUNTER — Telehealth: Payer: Self-pay | Admitting: Family Medicine

## 2022-09-05 VITALS — BP 168/81 | HR 70

## 2022-09-05 DIAGNOSIS — L821 Other seborrheic keratosis: Secondary | ICD-10-CM | POA: Diagnosis not present

## 2022-09-05 DIAGNOSIS — L570 Actinic keratosis: Secondary | ICD-10-CM | POA: Diagnosis not present

## 2022-09-05 DIAGNOSIS — D225 Melanocytic nevi of trunk: Secondary | ICD-10-CM

## 2022-09-05 DIAGNOSIS — L578 Other skin changes due to chronic exposure to nonionizing radiation: Secondary | ICD-10-CM

## 2022-09-05 DIAGNOSIS — Z1283 Encounter for screening for malignant neoplasm of skin: Secondary | ICD-10-CM

## 2022-09-05 DIAGNOSIS — L814 Other melanin hyperpigmentation: Secondary | ICD-10-CM | POA: Diagnosis not present

## 2022-09-05 DIAGNOSIS — D229 Melanocytic nevi, unspecified: Secondary | ICD-10-CM

## 2022-09-05 DIAGNOSIS — Z8589 Personal history of malignant neoplasm of other organs and systems: Secondary | ICD-10-CM

## 2022-09-05 DIAGNOSIS — L820 Inflamed seborrheic keratosis: Secondary | ICD-10-CM | POA: Diagnosis not present

## 2022-09-05 DIAGNOSIS — D1801 Hemangioma of skin and subcutaneous tissue: Secondary | ICD-10-CM

## 2022-09-05 DIAGNOSIS — Z85828 Personal history of other malignant neoplasm of skin: Secondary | ICD-10-CM | POA: Diagnosis not present

## 2022-09-05 NOTE — Telephone Encounter (Signed)
Contacted Zachary Wall to schedule their annual wellness visit. Appointment made for 10/04/2022.  Sherol Dade; [facility] Direct Dial: [phone]

## 2022-09-05 NOTE — Patient Instructions (Addendum)
Wound Care Instructions  Cleanse wound gently with soap and water once a day then pat dry with clean gauze. Apply a thin coat of Petrolatum (petroleum jelly, "Vaseline") over the wound (unless you have an allergy to this). We recommend that you use a new, sterile tube of Vaseline. Do not pick or remove scabs. Do not remove the yellow or white "healing tissue" from the base of the wound.  Cover the wound with fresh, clean, nonstick gauze and secure with paper tape. You may use Band-Aids in place of gauze and tape if the wound is small enough, but would recommend trimming much of the tape off as there is often too much. Sometimes Band-Aids can irritate the skin.  You should call the office for your biopsy report after 1 week if you have not already been contacted.  If you experience any problems, such as abnormal amounts of bleeding, swelling, significant bruising, significant pain, or evidence of infection, please call the office immediately.  FOR ADULT SURGERY PATIENTS: If you need something for pain relief you may take 1 extra strength Tylenol (acetaminophen) AND 2 Ibuprofen (200mg each) together every 4 hours as needed for pain. (do not take these if you are allergic to them or if you have a reason you should not take them.) Typically, you may only need pain medication for 1 to 3 days.     Cryotherapy Aftercare  Wash gently with soap and water everyday.   Apply Vaseline and Band-Aid daily until healed.    Due to recent changes in healthcare laws, you may see results of your pathology and/or laboratory studies on MyChart before the doctors have had a chance to review them. We understand that in some cases there may be results that are confusing or concerning to you. Please understand that not all results are received at the same time and often the doctors may need to interpret multiple results in order to provide you with the best plan of care or course of treatment. Therefore, we ask that you  please give us 2 business days to thoroughly review all your results before contacting the office for clarification. Should we see a critical lab result, you will be contacted sooner.   If You Need Anything After Your Visit  If you have any questions or concerns for your doctor, please call our main line at [phone] and press option 4 to reach your doctor's medical assistant. If no one answers, please leave a voicemail as directed and we will return your call as soon as possible. Messages left after 4 pm will be answered the following business day.   You may also send us a message via MyChart. We typically respond to MyChart messages within 1-2 business days.  For prescription refills, please ask your pharmacy to contact our office. Our fax number is [phone].  If you have an urgent issue when the clinic is closed that cannot wait until the next business day, you can page your doctor at the number below.    Please note that while we do our best to be available for urgent issues outside of office hours, we are not available 24/7.   If you have an urgent issue and are unable to reach us, you may choose to seek medical care at your doctor's office, retail clinic, urgent care center, or emergency room.  If you have a medical emergency, please immediately call 911 or go to the emergency department.  Pager Numbers  - Dr. Kowalski: [phone]  -   Dr. Moye: [phone]  - Dr. Stewart: [phone]  In the event of inclement weather, please call our main line at [phone] for an update on the status of any delays or closures.  Dermatology Medication Tips: Please keep the boxes that topical medications come in in order to help keep track of the instructions about where and how to use these. Pharmacies typically print the medication instructions only on the boxes and not directly on the medication tubes.   If your medication is too expensive, please contact our office at  [phone] option 4 or send us a message through MyChart.   We are unable to tell what your co-pay for medications will be in advance as this is different depending on your insurance coverage. However, we may be able to find a substitute medication at lower cost or fill out paperwork to get insurance to cover a needed medication.   If a prior authorization is required to get your medication covered by your insurance company, please allow us 1-2 business days to complete this process.  Drug prices often vary depending on where the prescription is filled and some pharmacies may offer cheaper prices.  The website www.goodrx.com contains coupons for medications through different pharmacies. The prices here do not account for what the cost may be with help from insurance (it may be cheaper with your insurance), but the website can give you the price if you did not use any insurance.  - You can print the associated coupon and take it with your prescription to the pharmacy.  - You may also stop by our office during regular business hours and pick up a GoodRx coupon card.  - If you need your prescription sent electronically to a different pharmacy, notify our office through [facility] MyChart or by phone at [phone] option 4.     Si Usted Necesita Algo Despus de Su Visita  Tambin puede enviarnos un mensaje a travs de MyChart. Por lo general respondemos a los mensajes de MyChart en el transcurso de 1 a 2 das hbiles.  Para renovar recetas, por favor pida a su farmacia que se ponga en contacto con nuestra oficina. Nuestro nmero de fax es el [phone].  Si tiene un asunto urgente cuando la clnica est cerrada y que no puede esperar hasta el siguiente da hbil, puede llamar/localizar a su doctor(a) al nmero que aparece a continuacin.   Por favor, tenga en cuenta que aunque hacemos todo lo posible para estar disponibles para asuntos urgentes fuera del horario de oficina, no estamos  disponibles las 24 horas del da, los 7 das de la semana.   Si tiene un problema urgente y no puede comunicarse con nosotros, puede optar por buscar atencin mdica  en el consultorio de su doctor(a), en una clnica privada, en un centro de atencin urgente o en una sala de emergencias.  Si tiene una emergencia mdica, por favor llame inmediatamente al 911 o vaya a la sala de emergencias.  Nmeros de bper  - Dr. Kowalski: [phone]  - Dra. Moye: [phone]  - Dra. Stewart: [phone]  En caso de inclemencias del tiempo, por favor llame a nuestra lnea principal al [phone] para una actualizacin sobre el estado de cualquier retraso o cierre.  Consejos para la medicacin en dermatologa: Por favor, guarde las cajas en las que vienen los medicamentos de uso tpico para ayudarle a seguir las instrucciones sobre dnde y cmo usarlos. Las farmacias generalmente imprimen las instrucciones del medicamento slo en las cajas y   no directamente en los tubos del medicamento.   Si su medicamento es muy caro, por favor, pngase en contacto con nuestra oficina llamando al [phone] y presione la opcin 4 o envenos un mensaje a travs de MyChart.   No podemos decirle cul ser su copago por los medicamentos por adelantado ya que esto es diferente dependiendo de la cobertura de su seguro. Sin embargo, es posible que podamos encontrar un medicamento sustituto a menor costo o llenar un formulario para que el seguro cubra el medicamento que se considera necesario.   Si se requiere una autorizacin previa para que su compaa de seguros cubra su medicamento, por favor permtanos de 1 a 2 das hbiles para completar este proceso.  Los precios de los medicamentos varan con frecuencia dependiendo del lugar de dnde se surte la receta y alguna farmacias pueden ofrecer precios ms baratos.  El sitio web www.goodrx.com tiene cupones para medicamentos de diferentes farmacias. Los precios aqu no  tienen en cuenta lo que podra costar con la ayuda del seguro (puede ser ms barato con su seguro), pero el sitio web puede darle el precio si no utiliz ningn seguro.  - Puede imprimir el cupn correspondiente y llevarlo con su receta a la farmacia.  - Tambin puede pasar por nuestra oficina durante el horario de atencin regular y recoger una tarjeta de cupones de GoodRx.  - Si necesita que su receta se enve electrnicamente a una farmacia diferente, informe a nuestra oficina a travs de MyChart de [place] o por telfono llamando al [phone] y presione la opcin 4.  

## 2022-09-05 NOTE — Progress Notes (Signed)
Follow-Up Visit   Subjective  Zachary Wall is a 72 y.o. male who presents for the following: Total body skin exam (Hx of SCC, SCC IS, AKs). The patient presents for Total-Body Skin Exam (TBSE) for skin cancer screening and mole check.  The patient has spots, moles and lesions to be evaluated, some may be new or changing and the patient has concerns that these could be cancer.   The following portions of the chart were reviewed this encounter and updated as appropriate:   Tobacco  Allergies  Meds  Problems  Med Hx  Surg Hx  Fam Hx     Review of Systems:  No other skin or systemic complaints except as noted in HPI or Assessment and Plan.  Objective  Well appearing patient in no apparent distress; mood and affect are within normal limits.  A full examination was performed including scalp, head, eyes, ears, nose, lips, neck, chest, axillae, abdomen, back, buttocks, bilateral upper extremities, bilateral lower extremities, hands, feet, fingers, toes, fingernails, and toenails. All findings within normal limits unless otherwise noted below.  back x 18 (18) Stuck on waxy paps with erythema  Left Upper Back  Left Upper Back 1.2 waxy keratotic pap  L cheek x 2 (2) Pink scaly macules   Assessment & Plan  Inflamed seborrheic keratosis (18) back x 18 Symptomatic, irritating, patient would like treated. Destruction of lesion - back x 18 Complexity: simple   Destruction method: cryotherapy   Informed consent: discussed and consent obtained   Timeout:  patient name, date of birth, surgical site, and procedure verified Lesion destroyed using liquid nitrogen: Yes   Region frozen until ice ball extended beyond lesion: Yes   Outcome: patient tolerated procedure well with no complications   Post-procedure details: wound care instructions given    Seborrheic keratosis, inflamed Left Upper Back Symptomatic, irritating, patient would like treated Shave removal today Epidermal /  dermal shaving - Left Upper Back  Lesion diameter (cm):  1.2 Informed consent: discussed and consent obtained   Timeout: patient name, date of birth, surgical site, and procedure verified   Procedure prep:  Patient was prepped and draped in usual sterile fashion Prep type:  Isopropyl alcohol Anesthesia: the lesion was anesthetized in a standard fashion   Anesthetic:  1% lidocaine w/ epinephrine 1-100,000 buffered w/ 8.4% NaHCO3 Instrument used: flexible razor blade   Hemostasis achieved with: pressure, aluminum chloride and electrodesiccation   Outcome: patient tolerated procedure well   Post-procedure details: sterile dressing applied and wound care instructions given   Dressing type: bandage and petrolatum    AK (actinic keratosis) (2) L cheek x 2 Destruction of lesion - L cheek x 2 Complexity: simple   Destruction method: cryotherapy   Informed consent: discussed and consent obtained   Timeout:  patient name, date of birth, surgical site, and procedure verified Lesion destroyed using liquid nitrogen: Yes   Region frozen until ice ball extended beyond lesion: Yes   Outcome: patient tolerated procedure well with no complications   Post-procedure details: wound care instructions given    Lentigines - Scattered tan macules - Due to sun exposure - Benign-appearing, observe - Recommend daily broad spectrum sunscreen SPF 30+ to sun-exposed areas, reapply every 2 hours as needed. - Call for any changes - upper back  Seborrheic Keratoses - Stuck-on, waxy, tan-brown papules and/or plaques  - Benign-appearing - Discussed benign etiology and prognosis. - Observe - Call for any changes - trunk, arms  Melanocytic Nevi -  Tan-brown and/or pink-flesh-colored symmetric macules and papules - Benign appearing on exam today - Observation - Call clinic for new or changing moles - Recommend daily use of broad spectrum spf 30+ sunscreen to sun-exposed areas.  - trunk  Hemangiomas - Red  papules - Discussed benign nature - Observe - Call for any changes  Actinic Damage - Chronic condition, secondary to cumulative UV/sun exposure - diffuse scaly erythematous macules with underlying dyspigmentation - Recommend daily broad spectrum sunscreen SPF 30+ to sun-exposed areas, reapply every 2 hours as needed.  - Staying in the shade or wearing long sleeves, sun glasses (UVA+UVB protection) and wide brim hats (4-inch brim around the entire circumference of the hat) are also recommended for sun protection.  - Call for new or changing lesions.  Skin cancer screening performed today.   History of Squamous Cell Carcinoma of the Skin - No evidence of recurrence today - No lymphadenopathy - Recommend regular full body skin exams - Recommend daily broad spectrum sunscreen SPF 30+ to sun-exposed areas, reapply every 2 hours as needed.  - Call if any new or changing lesions are noted between office visits - multiple  History of Squamous Cell Carcinoma in Situ of the Skin - No evidence of recurrence today - Recommend regular full body skin exams - Recommend daily broad spectrum sunscreen SPF 30+ to sun-exposed areas, reapply every 2 hours as needed.  - Call if any new or changing lesions are noted between office visits  - L prox volar forearm  Return in about 1 year (around 09/05/2023) for TBSE, Hx of SCC, Hx of AKs. Documentation: I have reviewed the above documentation for accuracy and completeness, and I agree with the above.  Sarina Ser, MD

## 2022-09-08 ENCOUNTER — Encounter: Payer: Self-pay | Admitting: Dermatology

## 2022-10-25 ENCOUNTER — Other Ambulatory Visit: Payer: Self-pay | Admitting: Family Medicine

## 2022-10-25 DIAGNOSIS — I10 Essential (primary) hypertension: Secondary | ICD-10-CM

## 2022-10-25 NOTE — Telephone Encounter (Signed)
Requested Prescriptions  Pending Prescriptions Disp Refills   valsartan (DIOVAN) 80 MG tablet [Pharmacy Med Name: VALSARTAN 80 MG TABLET] 90 tablet 0    Sig: TAKE 1 TABLET BY MOUTH EVERY DAY     Cardiovascular:  Angiotensin Receptor Blockers Failed - 10/25/2022  1:50 AM      Failed - Cr in normal range and within 180 days    Creat  Date Value Ref Range Status  02/12/2022 0.94 0.70 - 1.35 mg/dL Final         Failed - K in normal range and within 180 days    Potassium  Date Value Ref Range Status  02/12/2022 4.5 3.5 - 5.3 mmol/L Final         Failed - Last BP in normal range    BP Readings from Last 1 Encounters:  09/05/22 (!) 168/81         Failed - Valid encounter within last 6 months    Recent Outpatient Visits           8 months ago Annual physical exam   [facility] Smitty Cords, DO   1 year ago Annual physical exam   [place] [facility] Smitty Cords, DO   2 years ago Annual physical exam   [place] [facility] [place], Netta Neat, DO   2 years ago Acute left-sided low back pain with left-sided sciatica   [place] [facility] [place], Netta Neat, DO   2 years ago Acute left-sided low back pain with right-sided sciatica   [place] [facility] Smitty Cords, DO       Future Appointments             In 3 months Althea Charon, Netta Neat, DO  [facility], [place]   In 10 months Deirdre Evener, MD [facility]            Passed - Patient is not pregnant

## 2022-10-31 ENCOUNTER — Ambulatory Visit (INDEPENDENT_AMBULATORY_CARE_PROVIDER_SITE_OTHER): Payer: Medicare HMO

## 2022-10-31 VITALS — BP 130/80 | Ht 72.0 in | Wt 243.0 lb

## 2022-10-31 DIAGNOSIS — Z0000 Encounter for general adult medical examination without abnormal findings: Secondary | ICD-10-CM

## 2022-10-31 NOTE — Progress Notes (Signed)
Subjective:   Zachary Wall is a 72 y.o. male who presents for Medicare Annual/Subsequent preventive examination.  Review of Systems     Cardiac Risk Factors include: advanced age (>20men, >25 women);dyslipidemia;male gender;hypertension;obesity (BMI >30kg/m2)     Objective:    Today's Vitals   10/31/22 1313  BP: 130/80  Weight: 243 lb (110.2 kg)  Height: 6' (1.829 m)   Body mass index is 32.96 kg/m.     10/31/2022    1:18 PM 09/21/2021    8:58 AM 05/08/2021    8:43 AM 09/19/2020    8:21 AM 08/18/2018    3:31 PM  Advanced Directives  Does Patient Have a Medical Advance Directive? No No No Yes No  Type of Aeronautical engineer of [place];Living will   Copy of Healthcare Power of Attorney in Chart?    No - copy requested   Would patient like information on creating a medical advance directive? No - Patient declined No - Patient declined       Current Medications (verified) Outpatient Encounter Medications as of 10/31/2022  Medication Sig   acetaminophen (TYLENOL) 500 MG chewable tablet Chew 500 mg by mouth every 6 (six) hours as needed for pain.   aspirin EC 81 MG tablet Take 1 tablet (81 mg total) daily by mouth.   omeprazole (PRILOSEC) 20 MG capsule TAKE 1 CAPSULE BY MOUTH EVERY DAY BEFORE BREAKFAST   sildenafil (REVATIO) 20 MG tablet Take 3-5 pills about 30 min prior to sex   valsartan (DIOVAN) 80 MG tablet TAKE 1 TABLET BY MOUTH EVERY DAY   polyethylene glycol-electrolytes (NULYTELY) 420 g solution PLEASE SEE ATTACHED FOR DETAILED DIRECTIONS (Patient not taking: Reported on 09/21/2021)   Tavaborole (KERYDIN) 5 % SOLN Apply 1 application topically at bedtime. Qhs to toenails (Patient not taking: Reported on 10/31/2022)   No facility-administered encounter medications on file as of 10/31/2022.    Allergies (verified) Ace inhibitors and Azithromycin   History: Past Medical History:  Diagnosis Date   Actinic keratosis    Cancer (HCC)    skin    Hypertension    SCC (squamous cell carcinoma) 02/05/2021   R volar forearm, EDC   SCC (squamous cell carcinoma) 02/27/2022   R hand posterior - ED&C   SCC (squamous cell carcinoma) 02/27/2022   L prox calf sup - ED&C   SCC (squamous cell carcinoma) 02/27/2022   L prox calf inf - ED&C   Squamous cell carcinoma of skin 06/17/2016   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   Squamous cell carcinoma of skin 05/02/2021   R distal calf - ED&C   Past Surgical History:  Procedure Laterality Date   COLONOSCOPY WITH PROPOFOL     COLONOSCOPY WITH PROPOFOL N/A 05/08/2021   Procedure: COLONOSCOPY WITH PROPOFOL;  Surgeon: Wyline Mood, MD;  Location: [facility] ENDOSCOPY;  Service: Gastroenterology;  Laterality: N/A;   SKIN CANCER EXCISION     Family History  Problem Relation Age of Onset   Hematuria Neg Hx    Prostate cancer Neg Hx    Tuberculosis Neg Hx    Kidney cancer Neg Hx    Bladder Cancer Neg Hx    Social History   Socioeconomic History   Marital status: Married    Spouse name: Not on file   Number of children: Not on file   Years of education: Not on file   Highest education level: Not on file  Occupational History   Not on  file  Tobacco Use   Smoking status: Never   Smokeless tobacco: Never  Substance and Sexual Activity   Alcohol use: No   Drug use: No   Sexual activity: Yes  Other Topics Concern   Not on file  Social History Narrative   Not on file   Social Determinants of Health   Financial Resource Strain: Low Risk  (10/31/2022)   Overall Financial Resource Strain (CARDIA)    Difficulty of Paying Living Expenses: Not hard at all  Food Insecurity: No Food Insecurity (10/31/2022)   Hunger Vital Sign    Worried About Running Out of Food in the Last Year: Never true    Ran Out of Food in the Last Year: Never true  Transportation Needs: No Transportation Needs (10/31/2022)   PRAPARE - Administrator, Civil Service (Medical): No    Lack of Transportation (Non-Medical):  No  Physical Activity: Insufficiently Active (10/31/2022)   Exercise Vital Sign    Days of Exercise per Week: 7 days    Minutes of Exercise per Session: 20 min  Stress: No Stress Concern Present (10/31/2022)   [employer] of Occupational Health - Occupational Stress Questionnaire    Feeling of Stress : Not at all  Social Connections: Moderately Integrated (10/31/2022)   Social Connection and Isolation Panel [NHANES]    Frequency of Communication with Friends and Family: Once a week    Frequency of Social Gatherings with Friends and Family: More than three times a week    Attends Religious Services: More than 4 times per year    Active Member of [employer] or Organizations: No    Attends Engineer, structural: Never    Marital Status: Married    Tobacco Counseling Counseling given: Not Answered   Clinical Intake:  Pre-visit preparation completed: Yes  Pain : No/denies pain     Nutritional Risks: None Diabetes: No  How often do you need to have someone help you when you read instructions, pamphlets, or other written materials from your doctor or pharmacy?: 1 - Never  Diabetic?no  Interpreter Needed?: No  Information entered by :: Kennedy Bucker, LPN   Activities of Daily Living    10/31/2022    1:22 PM 10/27/2022    9:10 AM  In your present state of health, do you have any difficulty performing the following activities:  Hearing? 0 0  Vision? 0 0  Difficulty concentrating or making decisions? 0 0  Walking or climbing stairs? 0 0  Dressing or bathing? 0 0  Doing errands, shopping? 0 0  Preparing Food and eating ? N N  Using the Toilet? N N  In the past six months, have you accidently leaked urine? N N  Do you have problems with loss of bowel control? N N  Managing your Medications? N N  Managing your Finances? N N  Housekeeping or managing your Housekeeping? N N    Patient Care Team: Smitty Cords, DO as PCP - General (Family  Medicine)  Indicate any recent Medical Services you may have received from other than [facility] providers in the past year (date may be approximate).     Assessment:   This is a routine wellness examination for [name].  Hearing/Vision screen Hearing Screening - Comments:: No aids Vision Screening - Comments:: No glasses  Dietary issues and exercise activities discussed: Current Exercise Habits: Home exercise routine, Type of exercise: walking, Time (Minutes): 20, Frequency (Times/Week): 7, Weekly Exercise (Minutes/Week): 140, Intensity: Mild  Goals Addressed             This Visit's Progress    DIET - INCREASE WATER INTAKE         Depression Screen    10/31/2022    1:17 PM 02/14/2022   10:06 AM 09/21/2021    8:57 AM 01/09/2021    2:19 PM 09/19/2020    8:21 AM 01/07/2020    2:01 PM 08/31/2019    2:23 PM  PHQ 2/9 Scores  PHQ - 2 Score 0 0 0 0 0 0 0  PHQ- 9 Score 0 0  0       Fall Risk    10/31/2022    1:20 PM 10/27/2022    9:10 AM 09/30/2022    9:06 AM 02/14/2022   10:06 AM 09/21/2021    8:59 AM  Fall Risk   Falls in the past year? 0 0 0 0 0  Number falls in past yr: 0 0 0 0 0  Injury with Fall? 0 0 0 0 0  Risk for fall due to : No Fall Risks   No Fall Risks No Fall Risks  Follow up Falls prevention discussed;Falls evaluation completed   Falls evaluation completed Falls evaluation completed    FALL RISK PREVENTION PERTAINING TO THE HOME:  Any stairs in or around the home? Yes  If so, are there any without handrails? No  Home free of loose throw rugs in walkways, pet beds, electrical cords, etc? Yes  Adequate lighting in your home to reduce risk of falls? Yes   ASSISTIVE DEVICES UTILIZED TO PREVENT FALLS:  Life alert? No  Use of a cane, walker or w/c? No  Grab bars in the bathroom? Yes  Shower chair or bench in shower? Yes  Elevated toilet seat or a handicapped toilet? Yes   TIMED UP AND GO:  Was the test performed? Yes .  Length of time to ambulate 10 feet: 4 sec.    Gait steady and fast without use of assistive device  Cognitive Function:    08/18/2018    3:54 PM  MMSE - Mini Mental State Exam  Orientation to time 5  Orientation to Place 5  Registration 3  Attention/ Calculation 5  Recall 3  Language- name 2 objects 2  Language- repeat 1  Language- follow 3 step command 3  Language- read & follow direction 1  Write a sentence 1  Copy design 1  Total score 30        09/19/2020    8:23 AM  6CIT Screen  What Year? 0 points  What month? 0 points  What time? 0 points  Count back from 20 0 points  Months in reverse 2 points  Repeat phrase 2 points  Total Score 4 points    Immunizations Immunization History  Administered Date(s) Administered   PFIZER(Purple Top)SARS-COV-2 Vaccination 08/25/2019, 09/14/2019, 04/17/2020   Tdap 12/13/2014    TDAP status: Up to date  Flu Vaccine status: Declined, Education has been provided regarding the importance of this vaccine but patient still declined. Advised may receive this vaccine at local pharmacy or Health Dept. Aware to provide a copy of the vaccination record if obtained from local pharmacy or Health Dept. Verbalized acceptance and understanding.  Pneumococcal vaccine status: Declined,  Education has been provided regarding the importance of this vaccine but patient still declined. Advised may receive this vaccine at local pharmacy or Health Dept. Aware to provide a copy of the vaccination record if obtained  from local pharmacy or Health Dept. Verbalized acceptance and understanding.   Covid-19 vaccine status: Completed vaccines  Qualifies for Shingles Vaccine? Yes   Zostavax completed No   Shingrix Completed?: No.    Education has been provided regarding the importance of this vaccine. Patient has been advised to call insurance company to determine out of pocket expense if they have not yet received this vaccine. Advised may also receive vaccine at local pharmacy or Health Dept.  Verbalized acceptance and understanding.  Screening Tests Health Maintenance  Topic Date Due   Zoster Vaccines- Shingrix (1 of 2) Never done   COVID-19 Vaccine (4 - 2023-24 season) 03/01/2022   Pneumonia Vaccine 58+ Years old (1 of 1 - PCV) 02/15/2023 (Originally 05/31/2018)   INFLUENZA VACCINE  01/30/2023   Medicare Annual Wellness (AWV)  10/31/2023   DTaP/Tdap/Td (2 - Td or Tdap) 12/12/2024   COLONOSCOPY (Pts 45-23yrs Insurance coverage will need to be confirmed)  05/08/2028   Hepatitis C Screening  Completed   HPV VACCINES  Aged Out    Health Maintenance  Health Maintenance Due  Topic Date Due   Zoster Vaccines- Shingrix (1 of 2) Never done   COVID-19 Vaccine (4 - 2023-24 season) 03/01/2022    Colorectal cancer screening: Type of screening: Colonoscopy. Completed 05/08/21. Repeat every 7 years  Lung Cancer Screening: (Low Dose CT Chest recommended if Age 96-80 years, 30 pack-year currently smoking OR have quit w/in 15years.) does not qualify.   Additional Screening:  Hepatitis C Screening: does qualify; Completed 04/21/17  Vision Screening: Recommended annual ophthalmology exams for early detection of glaucoma and other disorders of the eye. Is the patient up to date with their annual eye exam?  No  Who is the provider or what is the name of the office in which the patient attends annual eye exams? No one If pt is not established with a provider, would they like to be referred to a provider to establish care? No .   Dental Screening: Recommended annual dental exams for proper oral hygiene  Community Resource Referral / Chronic Care Management: CRR required this visit?  No   CCM required this visit?  No      Plan:     I have personally reviewed and noted the following in the patient's chart:   Medical and social history Use of alcohol, tobacco or illicit drugs  Current medications and supplements including opioid prescriptions. Patient is not currently taking  opioid prescriptions. Functional ability and status Nutritional status Physical activity Advanced directives List of other physicians Hospitalizations, surgeries, and ER visits in previous 12 months Vitals Screenings to include cognitive, depression, and falls Referrals and appointments  In addition, I have reviewed and discussed with patient certain preventive protocols, quality metrics, and best practice recommendations. A written personalized care plan for preventive services as well as general preventive health recommendations were provided to patient.     Hal Hope, LPN   07/06/1094   Nurse Notes: none

## 2022-10-31 NOTE — Patient Instructions (Signed)
Zachary Wall , Thank you for taking time to come for your Medicare Wellness Visit. I appreciate your ongoing commitment to your health goals. Please review the following plan we discussed and let me know if I can assist you in the future.   These are the goals we discussed:  Goals      DIET - EAT MORE FRUITS AND VEGETABLES     DIET - INCREASE WATER INTAKE     Patient Stated     09/19/2020, no goals     Weight (lb) < 200 lb (90.7 kg)        This is a list of the screening recommended for you and due dates:  Health Maintenance  Topic Date Due   Zoster (Shingles) Vaccine (1 of 2) Never done   COVID-19 Vaccine (4 - 2023-24 season) 03/01/2022   Pneumonia Vaccine (1 of 1 - PCV) 02/15/2023*   Flu Shot  01/30/2023   Medicare Annual Wellness Visit  10/31/2023   DTaP/Tdap/Td vaccine (2 - Td or Tdap) 12/12/2024   Colon Cancer Screening  05/08/2028   Hepatitis C Screening: USPSTF Recommendation to screen - Ages 18-79 yo.  Completed   HPV Vaccine  Aged Out  *Topic was postponed. The date shown is not the original due date.    Advanced directives: no   Conditions/risks identified: none  Next appointment: Follow up in one year for your annual wellness visit. 11/06/23 @ 1:00 pm in person  Preventive Care 65 Years and Older, Male  Preventive care refers to lifestyle choices and visits with your health care provider that can promote health and wellness. What does preventive care include? A yearly physical exam. This is also called an annual well check. Dental exams once or twice a year. Routine eye exams. Ask your health care provider how often you should have your eyes checked. Personal lifestyle choices, including: Daily care of your teeth and gums. Regular physical activity. Eating a healthy diet. Avoiding tobacco and drug use. Limiting alcohol use. Practicing safe sex. Taking low doses of aspirin every day. Taking vitamin and mineral supplements as recommended by your health care  provider. What happens during an annual well check? The services and screenings done by your health care provider during your annual well check will depend on your age, overall health, lifestyle risk factors, and family history of disease. Counseling  Your health care provider may ask you questions about your: Alcohol use. Tobacco use. Drug use. Emotional well-being. Home and relationship well-being. Sexual activity. Eating habits. History of falls. Memory and ability to understand (cognition). Work and work Astronomer. Screening  You may have the following tests or measurements: Height, weight, and BMI. Blood pressure. Lipid and cholesterol levels. These may be checked every 5 years, or more frequently if you are over 71 years old. Skin check. Lung cancer screening. You may have this screening every year starting at age 55 if you have a 30-pack-year history of smoking and currently smoke or have quit within the past 15 years. Fecal occult blood test (FOBT) of the stool. You may have this test every year starting at age 56. Flexible sigmoidoscopy or colonoscopy. You may have a sigmoidoscopy every 5 years or a colonoscopy every 10 years starting at age 43. Prostate cancer screening. Recommendations will vary depending on your family history and other risks. Hepatitis C blood test. Hepatitis B blood test. Sexually transmitted disease (STD) testing. Diabetes screening. This is done by checking your blood sugar (glucose) after you have not  eaten for a while (fasting). You may have this done every 1-3 years. Abdominal aortic aneurysm (AAA) screening. You may need this if you are a current or former smoker. Osteoporosis. You may be screened starting at age 39 if you are at high risk. Talk with your health care provider about your test results, treatment options, and if necessary, the need for more tests. Vaccines  Your health care provider may recommend certain vaccines, such  as: Influenza vaccine. This is recommended every year. Tetanus, diphtheria, and acellular pertussis (Tdap, Td) vaccine. You may need a Td booster every 10 years. Zoster vaccine. You may need this after age 73. Pneumococcal 13-valent conjugate (PCV13) vaccine. One dose is recommended after age 74. Pneumococcal polysaccharide (PPSV23) vaccine. One dose is recommended after age 51. Talk to your health care provider about which screenings and vaccines you need and how often you need them. This information is not intended to replace advice given to you by your health care provider. Make sure you discuss any questions you have with your health care provider. Document Released: 07/14/2015 Document Revised: 03/06/2016 Document Reviewed: 04/18/2015 Elsevier Interactive Patient Education  2017 [employer] Prevention in the Home Falls can cause injuries. They can happen to people of all ages. There are many things you can do to make your home safe and to help prevent falls. What can I do on the outside of my home? Regularly fix the edges of walkways and driveways and fix any cracks. Remove anything that might make you trip as you walk through a door, such as a raised step or threshold. Trim any bushes or trees on the path to your home. Use bright outdoor lighting. Clear any walking paths of anything that might make someone trip, such as rocks or tools. Regularly check to see if handrails are loose or broken. Make sure that both sides of any steps have handrails. Any raised decks and porches should have guardrails on the edges. Have any leaves, snow, or ice cleared regularly. Use sand or salt on walking paths during winter. Clean up any spills in your garage right away. This includes oil or grease spills. What can I do in the bathroom? Use night lights. Install grab bars by the toilet and in the tub and shower. Do not use towel bars as grab bars. Use non-skid mats or decals in the tub or  shower. If you need to sit down in the shower, use a plastic, non-slip stool. Keep the floor dry. Clean up any water that spills on the floor as soon as it happens. Remove soap buildup in the tub or shower regularly. Attach bath mats securely with double-sided non-slip rug tape. Do not have throw rugs and other things on the floor that can make you trip. What can I do in the bedroom? Use night lights. Make sure that you have a light by your bed that is easy to reach. Do not use any sheets or blankets that are too big for your bed. They should not hang down onto the floor. Have a firm chair that has side arms. You can use this for support while you get dressed. Do not have throw rugs and other things on the floor that can make you trip. What can I do in the kitchen? Clean up any spills right away. Avoid walking on wet floors. Keep items that you use a lot in easy-to-reach places. If you need to reach something above you, use a strong step stool that  has a grab bar. Keep electrical cords out of the way. Do not use floor polish or wax that makes floors slippery. If you must use wax, use non-skid floor wax. Do not have throw rugs and other things on the floor that can make you trip. What can I do with my stairs? Do not leave any items on the stairs. Make sure that there are handrails on both sides of the stairs and use them. Fix handrails that are broken or loose. Make sure that handrails are as long as the stairways. Check any carpeting to make sure that it is firmly attached to the stairs. Fix any carpet that is loose or worn. Avoid having throw rugs at the top or bottom of the stairs. If you do have throw rugs, attach them to the floor with carpet tape. Make sure that you have a light switch at the top of the stairs and the bottom of the stairs. If you do not have them, ask someone to add them for you. What else can I do to help prevent falls? Wear shoes that: Do not have high heels. Have  rubber bottoms. Are comfortable and fit you well. Are closed at the toe. Do not wear sandals. If you use a stepladder: Make sure that it is fully opened. Do not climb a closed stepladder. Make sure that both sides of the stepladder are locked into place. Ask someone to hold it for you, if possible. Clearly mark and make sure that you can see: Any grab bars or handrails. First and last steps. Where the edge of each step is. Use tools that help you move around (mobility aids) if they are needed. These include: Canes. Walkers. Scooters. Crutches. Turn on the lights when you go into a dark area. Replace any light bulbs as soon as they burn out. Set up your furniture so you have a clear path. Avoid moving your furniture around. If any of your floors are uneven, fix them. If there are any pets around you, be aware of where they are. Review your medicines with your doctor. Some medicines can make you feel dizzy. This can increase your chance of falling. Ask your doctor what other things that you can do to help prevent falls. This information is not intended to replace advice given to you by your health care provider. Make sure you discuss any questions you have with your health care provider. Document Released: 04/13/2009 Document Revised: 11/23/2015 Document Reviewed: 07/22/2014 Elsevier Interactive Patient Education  2017 [employer].

## 2023-01-12 ENCOUNTER — Other Ambulatory Visit: Payer: Self-pay | Admitting: Family Medicine

## 2023-01-12 DIAGNOSIS — K219 Gastro-esophageal reflux disease without esophagitis: Secondary | ICD-10-CM

## 2023-01-13 NOTE — Telephone Encounter (Signed)
Requested Prescriptions  Pending Prescriptions Disp Refills   omeprazole (PRILOSEC) 20 MG capsule [Pharmacy Med Name: OMEPRAZOLE DR 20 MG CAPSULE] 90 capsule 0    Sig: TAKE 1 CAPSULE BY MOUTH EVERY DAY BEFORE BREAKFAST     Gastroenterology: Proton Pump Inhibitors Passed - 01/12/2023  8:44 AM      Passed - Valid encounter within last 12 months    Recent Outpatient Visits           11 months ago Annual physical exam   [place] [facility] Althea Charon, Netta Neat, DO   2 years ago Annual physical exam   [place] [facility] Smitty Cords, DO   3 years ago Annual physical exam   [place] [facility] [place], Netta Neat, DO   3 years ago Acute left-sided low back pain with left-sided sciatica   [place] [facility] Smitty Cords, DO   3 years ago Acute left-sided low back pain with right-sided sciatica   [place] [facility], Netta Neat, DO       Future Appointments             In 1 month Althea Charon, Netta Neat, DO [facility], [place]   In 8 months Deirdre Evener, MD [facility]

## 2023-01-31 ENCOUNTER — Other Ambulatory Visit: Payer: Self-pay | Admitting: Family Medicine

## 2023-01-31 DIAGNOSIS — I10 Essential (primary) hypertension: Secondary | ICD-10-CM

## 2023-01-31 NOTE — Telephone Encounter (Signed)
Requested Prescriptions  Pending Prescriptions Disp Refills   valsartan (DIOVAN) 80 MG tablet [Pharmacy Med Name: VALSARTAN 80 MG TABLET] 90 tablet 0    Sig: TAKE 1 TABLET BY MOUTH EVERY DAY     Cardiovascular:  Angiotensin Receptor Blockers Failed - 01/31/2023  2:45 AM      Failed - Cr in normal range and within 180 days    Creat  Date Value Ref Range Status  02/12/2022 0.94 0.70 - 1.35 mg/dL Final         Failed - K in normal range and within 180 days    Potassium  Date Value Ref Range Status  02/12/2022 4.5 3.5 - 5.3 mmol/L Final         Passed - Patient is not pregnant      Passed - Last BP in normal range    BP Readings from Last 1 Encounters:  10/31/22 130/80         Passed - Valid encounter within last 6 months    Recent Outpatient Visits           11 months ago Annual physical exam   [place] [facility] Smitty Cords, DO   2 years ago Annual physical exam   [place] [facility] Smitty Cords, DO   3 years ago Annual physical exam   [facility] [facility] [place], Netta Neat, DO   3 years ago Acute left-sided low back pain with left-sided sciatica   [place] [facility] [place], Netta Neat, DO   3 years ago Acute left-sided low back pain with right-sided sciatica   [place] [facility] [place], Netta Neat, DO       Future Appointments             In 2 weeks Althea Charon, Netta Neat, DO [place] [facility], [place]   In 7 months Deirdre Evener, MD [facility]

## 2023-02-11 ENCOUNTER — Other Ambulatory Visit: Payer: Medicare HMO

## 2023-02-11 DIAGNOSIS — E7800 Pure hypercholesterolemia, unspecified: Secondary | ICD-10-CM | POA: Diagnosis not present

## 2023-02-11 DIAGNOSIS — R7989 Other specified abnormal findings of blood chemistry: Secondary | ICD-10-CM | POA: Diagnosis not present

## 2023-02-11 DIAGNOSIS — Z0000 Encounter for general adult medical examination without abnormal findings: Secondary | ICD-10-CM | POA: Diagnosis not present

## 2023-02-11 DIAGNOSIS — Z8619 Personal history of other infectious and parasitic diseases: Secondary | ICD-10-CM | POA: Diagnosis not present

## 2023-02-11 DIAGNOSIS — I10 Essential (primary) hypertension: Secondary | ICD-10-CM

## 2023-02-11 DIAGNOSIS — Z125 Encounter for screening for malignant neoplasm of prostate: Secondary | ICD-10-CM | POA: Diagnosis not present

## 2023-02-11 DIAGNOSIS — R7303 Prediabetes: Secondary | ICD-10-CM | POA: Diagnosis not present

## 2023-02-11 DIAGNOSIS — R972 Elevated prostate specific antigen [PSA]: Secondary | ICD-10-CM

## 2023-02-11 DIAGNOSIS — E669 Obesity, unspecified: Secondary | ICD-10-CM

## 2023-02-18 ENCOUNTER — Ambulatory Visit (INDEPENDENT_AMBULATORY_CARE_PROVIDER_SITE_OTHER): Payer: Medicare HMO | Admitting: Family Medicine

## 2023-02-18 ENCOUNTER — Encounter: Payer: Self-pay | Admitting: Family Medicine

## 2023-02-18 VITALS — BP 140/72 | HR 70 | Ht 72.0 in | Wt 239.4 lb

## 2023-02-18 DIAGNOSIS — K219 Gastro-esophageal reflux disease without esophagitis: Secondary | ICD-10-CM | POA: Diagnosis not present

## 2023-02-18 DIAGNOSIS — E669 Obesity, unspecified: Secondary | ICD-10-CM

## 2023-02-18 DIAGNOSIS — R7303 Prediabetes: Secondary | ICD-10-CM | POA: Diagnosis not present

## 2023-02-18 DIAGNOSIS — Z0000 Encounter for general adult medical examination without abnormal findings: Secondary | ICD-10-CM | POA: Diagnosis not present

## 2023-02-18 DIAGNOSIS — N528 Other male erectile dysfunction: Secondary | ICD-10-CM | POA: Diagnosis not present

## 2023-02-18 DIAGNOSIS — I10 Essential (primary) hypertension: Secondary | ICD-10-CM | POA: Diagnosis not present

## 2023-02-18 DIAGNOSIS — E66811 Obesity, class 1: Secondary | ICD-10-CM

## 2023-02-18 DIAGNOSIS — E7800 Pure hypercholesterolemia, unspecified: Secondary | ICD-10-CM | POA: Diagnosis not present

## 2023-02-18 MED ORDER — VALSARTAN 160 MG PO TABS
160.0000 mg | ORAL_TABLET | Freq: Every day | ORAL | 1 refills | Status: DC
Start: 2023-02-18 — End: 2023-08-15

## 2023-02-18 MED ORDER — OMEPRAZOLE 20 MG PO CPDR
20.0000 mg | DELAYED_RELEASE_CAPSULE | Freq: Every day | ORAL | 3 refills | Status: DC
Start: 2023-02-18 — End: 2024-03-04

## 2023-02-18 MED ORDER — SILDENAFIL CITRATE 20 MG PO TABS: ORAL_TABLET | ORAL | 11 refills | Status: AC

## 2023-02-18 NOTE — Patient Instructions (Addendum)
Thank you for coming to the office today.  Dose increase Valsartan BP med from 80mg  up to 160mg  daily, new rx sent for future, but can double your current.  Re ordered Sildenafil and Omeprazole  Goodrx coupon.  Labs overall stable except higher A1c sugar.  Goal to limit carb starch sugars. No medication required.  Next time repeat sugar A1c  Thyroid is still off but not at the point of needing medicine.  Please schedule a Follow-up Appointment to: Return in about 6 months (around 08/21/2023) for 6 month PreDM A1c, HTN.  If you have any other questions or concerns, please feel free to call the office or send a message through MyChart. You may also schedule an earlier appointment if necessary.  Additionally, you may be receiving a survey about your experience at our office within a few days to 1 week by e-mail or mail. We value your feedback.  Saralyn Pilar, DO [facility], [place]

## 2023-02-18 NOTE — Assessment & Plan Note (Signed)
Elevated LDL 130s  The 10-year ASCVD risk score (Arnett DK, et al., 2019) is: 22%   Plan: 1. Reviewed ASCVD risk and offered statin therapy - patient declines again at this time, prefers to only take ASA for now and improve lifestyle, understands counseling on benefits and risk reduction of statin 2. Continue ASA 81mg  for primary ASCVD risk reduction 3. Encourage improved lifestyle - low carb/cholesterol, reduce portion size, continue improving regular exercise 4. Follow-up yearly lipids

## 2023-02-18 NOTE — Assessment & Plan Note (Signed)
Elevated BP Home reviewed No known complications  Failed Lisinopril (ACEi cough)    Plan:  Elevated BP will dose increase Med Dose increase Valsartan BP med from 80mg  up to 160mg  daily, new rx sent for future, but can double your current.  2. Encourage improved lifestyle - low sodium diet, regular exercise 3. Continue monitor BP outside office, bring readings to next visit, if persistently >140/90 or new symptoms notify office sooner

## 2023-02-18 NOTE — Assessment & Plan Note (Addendum)
A1c up to 6.3 notable increase With HLD and HTN, fairly well controlled overall Wt stable  Plan:  1. Not on any therapy currently  2. Encourage improved lifestyle - low carb, low sugar diet, reduce portion size, continue improving regular exercise  6 mo

## 2023-02-18 NOTE — Progress Notes (Signed)
Subjective:    Patient ID: Zachary Wall, male    DOB: 11/20/1950, 72 y.o.   MRN: [id number]  Zachary Wall is a 72 y.o. male presenting on 02/18/2023 for Annual Exam   HPI  Here for Annual Physical and Lab Review   Pre-Diabetes / OBESITY BMI >32 Last Lab A1c 6.3, previously 5.8 to 6.0 to 6.1 No new concerns today. He admits goal to improve diet and not take medication Lifestyle: - Weight stable - Diet: stable diet, limiting carbs and sweets, drinks mostly water, 1-2 diet drinks daily - Exercise: not as active due to heat. Denies hypoglycemia, polyuria, visual changes, numbness or tingling.   CHRONIC HTN: Home BP normal readings and some mild elevated. No severe range. He check weekly. Current Meds - Valsartan 80mg  daily Reports good compliance, took meds today. Tolerating well, w/o complaints. Lifestyle: (see below) Denies CP, dyspnea, HA, edema, dizziness / lightheadedness   Erectile Dysfunction Stable chronic problem. He takes Sildenafil up to 3 pills 20mg  each PRN with good results, request refill today   HYPERLIPIDEMIA: - Reports no concerns. Last lipid panel 01/2023 improved LDL 113 down to 104 - Currently not on Statin. Not interested. - Taking ASA EC 81mg  daily since last visit, tolerating well - he does admit some mild easy bleeding, but doing fine - No known CVD or MI in family history  Elevated TSH TSH trend increased in past 1 year, TSH 3.84 > 4.83 > 6.91, and T4 1.1 remains normalized.      Health Maintenance:    Prostate CA Screening: Previously followed by [facility] due to elevated PSA in past. Recent results were range of 1 to 3. Last PSA 2.01 (01/2023) Currently asymptomatic without LUTS. No known family history of prostate CA   UTD colonoscopy - last 2016, per Dr Lynnae Prude - scanned into chart  DUE 5 year, now tubular adenoma, x 1 Repeat Colonoscopy 05/08/21, next due 2029   UTD Hep C and HIV routine screening, negative 03/2017   Due for -  Shingrix. Unsure if he had Chicken Pox in past was told he did not have rash but was exposed.     02/18/2023    2:03 PM 10/31/2022    1:17 PM 02/14/2022   10:06 AM  Depression screen PHQ 2/9  Decreased Interest 0 0 0  Down, Depressed, Hopeless 0 0 0  PHQ - 2 Score 0 0 0  Altered sleeping 0 0 0  Tired, decreased energy 0 0 0  Change in appetite 0 0 0  Feeling bad or failure about yourself  0 0 0  Trouble concentrating 0 0 0  Moving slowly or fidgety/restless 0 0 0  Suicidal thoughts 0 0 0  PHQ-9 Score 0 0 0  Difficult doing work/chores Not difficult at all Not difficult at all Not difficult at all    Past Medical History:  Diagnosis Date  . Actinic keratosis   . Cancer (HCC)    skin  . Hypertension   . 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 SCC (squamous cell carcinoma) 02/27/2022   R hand posterior - ED&C  . SCC (squamous cell carcinoma) 02/27/2022   L prox calf sup - ED&C  . SCC (squamous cell carcinoma) 02/27/2022   L prox calf inf - ED&C  . 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 Squamous cell carcinoma of skin 05/02/2021  R distal calf - ED&C   Past Surgical History:  Procedure Laterality Date  . COLONOSCOPY WITH PROPOFOL    . COLONOSCOPY WITH PROPOFOL N/A 05/08/2021   Procedure: COLONOSCOPY WITH PROPOFOL;  Surgeon: Wyline Mood, MD;  Location: [facility] ENDOSCOPY;  Service: Gastroenterology;  Laterality: N/A;  . SKIN CANCER EXCISION     Social History   Socioeconomic History  . Marital status: Married    Spouse name: Not on file  . Number of children: Not on file  . Years of education: Not on file  . Highest education level: Not on file  Occupational History  . Not on file  Tobacco Use  . Smoking status: Never  . Smokeless tobacco: Never  Substance and Sexual Activity  . Alcohol use: No  . Drug use: No  . Sexual activity: Yes  Other Topics Concern  . Not on file  Social History Narrative   . Not on file   Social Determinants of Health   Financial Resource Strain: Low Risk  (10/31/2022)   Overall Financial Resource Strain (CARDIA)   . Difficulty of Paying Living Expenses: Not hard at all  Food Insecurity: No Food Insecurity (10/31/2022)   Hunger Vital Sign   . Worried About Programme researcher, broadcasting/film/video in the Last Year: Never true   . Ran Out of Food in the Last Year: Never true  Transportation Needs: No Transportation Needs (10/31/2022)   PRAPARE - Transportation   . Lack of Transportation (Medical): No   . Lack of Transportation (Non-Medical): No  Physical Activity: Insufficiently Active (10/31/2022)   Exercise Vital Sign   . Days of Exercise per Week: 7 days   . Minutes of Exercise per Session: 20 min  Stress: No Stress Concern Present (10/31/2022)   [employer] of Occupational Health - Occupational Stress Questionnaire   . Feeling of Stress : Not at all  Social Connections: Moderately Integrated (10/31/2022)   Social Connection and Isolation Panel [NHANES]   . Frequency of Communication with Friends and Family: Once a week   . Frequency of Social Gatherings with Friends and Family: More than three times a week   . Attends Religious Services: More than 4 times per year   . Active Member of Clubs or Organizations: No   . Attends Banker Meetings: Never   . Marital Status: Married  Catering manager Violence: Not At Risk (10/31/2022)   Humiliation, Afraid, Rape, and Kick questionnaire   . Fear of Current or Ex-Partner: No   . Emotionally Abused: No   . Physically Abused: No   . Sexually Abused: No   Family History  Problem Relation Age of Onset  . Hematuria Neg Hx   . Prostate cancer Neg Hx   . Tuberculosis Neg Hx   . Kidney cancer Neg Hx   . Bladder Cancer Neg Hx    Current Outpatient Medications on File Prior to Visit  Medication Sig  . acetaminophen (TYLENOL) 500 MG chewable tablet Chew 500 mg by mouth every 6 (six) hours as needed for pain.  Marland Kitchen aspirin  EC 81 MG tablet Take 1 tablet (81 mg total) daily by mouth.   No current facility-administered medications on file prior to visit.    Review of Systems  Constitutional:  Negative for activity change, appetite change, chills, diaphoresis, fatigue and fever.  HENT:  Negative for congestion and hearing loss.   Eyes:  Negative for visual disturbance.  Respiratory:  Negative for cough, chest tightness, shortness of breath and  wheezing.   Cardiovascular:  Negative for chest pain, palpitations and leg swelling.  Gastrointestinal:  Negative for abdominal pain, constipation, diarrhea, nausea and vomiting.  Genitourinary:  Negative for dysuria, frequency and hematuria.  Musculoskeletal:  Negative for arthralgias and neck pain.  Skin:  Negative for rash.  Neurological:  Negative for dizziness, weakness, light-headedness, numbness and headaches.  Hematological:  Negative for adenopathy.  Psychiatric/Behavioral:  Negative for behavioral problems, dysphoric mood and sleep disturbance.    Per HPI unless specifically indicated above      Objective:    BP (!) 140/72 (BP Location: Left Arm, Cuff Size: Normal)   Pulse 70   Ht 6' (1.829 m)   Wt 239 lb 6.4 oz (108.6 kg)   SpO2 97%   BMI 32.47 kg/m   Wt Readings from Last 3 Encounters:  02/18/23 239 lb 6.4 oz (108.6 kg)  10/31/22 243 lb (110.2 kg)  02/14/22 247 lb 12.8 oz (112.4 kg)    Physical Exam Vitals and nursing note reviewed.  Constitutional:      General: He is not in acute distress.    Appearance: He is well-developed. He is not diaphoretic.     Comments: Well-appearing, comfortable, cooperative  HENT:     Head: Normocephalic and atraumatic.  Eyes:     General:        Right eye: No discharge.        Left eye: No discharge.     Conjunctiva/sclera: Conjunctivae normal.     Pupils: Pupils are equal, round, and reactive to light.  Neck:     Thyroid: No thyromegaly.     Vascular: No carotid bruit.  Cardiovascular:     Rate and  Rhythm: Normal rate and regular rhythm.     Pulses: Normal pulses.     Heart sounds: Normal heart sounds. No murmur heard. Pulmonary:     Effort: Pulmonary effort is normal. No respiratory distress.     Breath sounds: Normal breath sounds. No wheezing or rales.  Abdominal:     General: Bowel sounds are normal. There is no distension.     Palpations: Abdomen is soft. There is no mass.     Tenderness: There is no abdominal tenderness.  Musculoskeletal:        General: No tenderness. Normal range of motion.     Cervical back: Normal range of motion and neck supple.     Right lower leg: No edema.     Left lower leg: No edema.     Comments: Upper / Lower Extremities: - Normal muscle tone, strength bilateral upper extremities 5/5, lower extremities 5/5  Lymphadenopathy:     Cervical: No cervical adenopathy.  Skin:    General: Skin is warm and dry.     Findings: No erythema or rash.  Neurological:     Mental Status: He is alert and oriented to person, place, and time.     Comments: Distal sensation intact to light touch all extremities  Psychiatric:        Mood and Affect: Mood normal.        Behavior: Behavior normal.        Thought Content: Thought content normal.     Comments: Well groomed, good eye contact, normal speech and thoughts   Results for orders placed or performed in visit on 02/11/23  Varicella zoster antibody, IgG  Result Value Ref Range   Varicella IgG 1,360.00 index  T4, free  Result Value Ref Range   Free T4 1.1  0.8 - 1.8 ng/dL  TSH  Result Value Ref Range   TSH 6.91 (H) 0.40 - 4.50 mIU/L  PSA  Result Value Ref Range   PSA 2.01 < OR = 4.00 ng/mL  Hemoglobin A1c  Result Value Ref Range   Hgb A1c MFr Bld 6.3 (H) <5.7 % of total Hgb   Mean Plasma Glucose 134 mg/dL   eAG (mmol/L) 7.4 mmol/L  Lipid panel  Result Value Ref Range   Cholesterol 187 <200 mg/dL   HDL 49 > OR = 40 mg/dL   Triglycerides 413 <244 mg/dL   LDL Cholesterol (Calc) 113 (H) mg/dL (calc)    Total CHOL/HDL Ratio 3.8 <5.0 (calc)   Non-HDL Cholesterol (Calc) 138 (H) <130 mg/dL (calc)  CBC with Differential/Platelet  Result Value Ref Range   WBC 5.8 3.8 - 10.8 Thousand/uL   RBC 5.13 4.20 - 5.80 Million/uL   Hemoglobin 16.6 13.2 - 17.1 g/dL   HCT 01.0 27.2 - 53.6 %   MCV 93.8 80.0 - 100.0 fL   MCH 32.4 27.0 - 33.0 pg   MCHC 34.5 32.0 - 36.0 g/dL   RDW 64.4 03.4 - 74.2 %   Platelets 207 140 - 400 Thousand/uL   MPV 10.4 7.5 - 12.5 fL   Neutro Abs 2,784 1,500 - 7,800 cells/uL   Lymphs Abs 2,256 850 - 3,900 cells/uL   Absolute Monocytes 551 200 - 950 cells/uL   Eosinophils Absolute 180 15 - 500 cells/uL   Basophils Absolute 29 0 - 200 cells/uL   Neutrophils Relative % 48 %   Total Lymphocyte 38.9 %   Monocytes Relative 9.5 %   Eosinophils Relative 3.1 %   Basophils Relative 0.5 %  COMPLETE METABOLIC PANEL WITH GFR  Result Value Ref Range   Glucose, Bld 109 (H) 65 - 99 mg/dL   BUN 14 7 - 25 mg/dL   Creat 5.95 6.38 - 7.56 mg/dL   eGFR 92 > OR = 60 EP/PIR/5.18A4   BUN/Creatinine Ratio SEE NOTE: 6 - 22 (calc)   Sodium 142 135 - 146 mmol/L   Potassium 4.5 3.5 - 5.3 mmol/L   Chloride 104 98 - 110 mmol/L   CO2 29 20 - 32 mmol/L   Calcium 9.2 8.6 - 10.3 mg/dL   Total Protein 6.6 6.1 - 8.1 g/dL   Albumin 4.1 3.6 - 5.1 g/dL   Globulin 2.5 1.9 - 3.7 g/dL (calc)   AG Ratio 1.6 1.0 - 2.5 (calc)   Total Bilirubin 0.6 0.2 - 1.2 mg/dL   Alkaline phosphatase (APISO) 74 35 - 144 U/L   AST 16 10 - 35 U/L   ALT 21 9 - 46 U/L      Assessment & Plan:   Problem List Items Addressed This Visit     Essential hypertension    Elevated BP Home reviewed No known complications  Failed Lisinopril (ACEi cough)    Plan:  Elevated BP will dose increase Med Dose increase Valsartan BP med from 80mg  up to 160mg  daily, new rx sent for future, but can double your current.  2. Encourage improved lifestyle - low sodium diet, regular exercise 3. Continue monitor BP outside office, bring  readings to next visit, if persistently >140/90 or new symptoms notify office sooner      Relevant Medications   valsartan (DIOVAN) 160 MG tablet   sildenafil (REVATIO) 20 MG tablet   Hyperlipidemia    Elevated LDL 130s  The 10-year ASCVD risk score (Arnett DK, et al., 2019)  is: 22%   Plan: 1. Reviewed ASCVD risk and offered statin therapy - patient declines again at this time, prefers to only take ASA for now and improve lifestyle, understands counseling on benefits and risk reduction of statin 2. Continue ASA 81mg  for primary ASCVD risk reduction 3. Encourage improved lifestyle - low carb/cholesterol, reduce portion size, continue improving regular exercise 4. Follow-up yearly lipids      Relevant Medications   valsartan (DIOVAN) 160 MG tablet   sildenafil (REVATIO) 20 MG tablet   Obesity (BMI 30.0-34.9)   Other male erectile dysfunction   Relevant Medications   sildenafil (REVATIO) 20 MG tablet   Pre-diabetes    A1c up to 6.3 notable increase With HLD and HTN, fairly well controlled overall Wt stable  Plan:  1. Not on any therapy currently  2. Encourage improved lifestyle - low carb, low sugar diet, reduce portion size, continue improving regular exercise  6 mo      Other Visit Diagnoses     Annual physical exam    -  Primary   Gastroesophageal reflux disease without esophagitis       Relevant Medications   omeprazole (PRILOSEC) 20 MG capsule       Updated Health Maintenance information Reviewed recent lab results with patient Encouraged improvement to lifestyle with diet and exercise Goal of weight loss  Re ordered Sildenafil and Omeprazole Goodrx coupon.  Labs overall stable except higher A1c sugar.  Goal to limit carb starch sugars. No medication required.  Elevated TSH >6 but normal T4 Thyroid is still off but not at the point of needing medicine.  Meds ordered this encounter  Medications  . valsartan (DIOVAN) 160 MG tablet    Sig: Take 1  tablet (160 mg total) by mouth daily.    Dispense:  90 tablet    Refill:  1    Dose increase from 80 to 160. He is not ready fill today.  Marland Kitchen omeprazole (PRILOSEC) 20 MG capsule    Sig: Take 1 capsule (20 mg total) by mouth daily before breakfast.    Dispense:  90 capsule    Refill:  3    Add future refills  . sildenafil (REVATIO) 20 MG tablet    Sig: Take 3-5 pills about 30 min prior to sex    Dispense:  30 tablet    Refill:  11    goodrx      Follow up plan: Return in about 6 months (around 08/21/2023) for 6 month PreDM A1c, HTN.  Saralyn Pilar, DO [facility] 02/18/2023, 2:07 PM

## 2023-05-18 ENCOUNTER — Other Ambulatory Visit: Payer: Self-pay | Admitting: Family Medicine

## 2023-05-18 DIAGNOSIS — I10 Essential (primary) hypertension: Secondary | ICD-10-CM

## 2023-05-19 NOTE — Telephone Encounter (Signed)
Requested Prescriptions  Refused Prescriptions Disp Refills   valsartan (DIOVAN) 160 MG tablet [Pharmacy Med Name: VALSARTAN 160 MG TABLET] 90 tablet 1    Sig: TAKE 1 TABLET BY MOUTH EVERY DAY     Cardiovascular:  Angiotensin Receptor Blockers Failed - 05/18/2023  2:17 PM      Failed - Last BP in normal range    BP Readings from Last 1 Encounters:  02/18/23 (!) 140/72         Passed - Cr in normal range and within 180 days    Creat  Date Value Ref Range Status  02/11/2023 0.90 0.70 - 1.35 mg/dL Final         Passed - K in normal range and within 180 days    Potassium  Date Value Ref Range Status  02/11/2023 4.5 3.5 - 5.3 mmol/L Final         Passed - Patient is not pregnant      Passed - Valid encounter within last 6 months    Recent Outpatient Visits           3 months ago Annual physical exam   [place] [facility] Smitty Cords, DO   1 year ago Annual physical exam   [place] [facility] Smitty Cords, DO   2 years ago Annual physical exam   [place] [facility] Smitty Cords, DO   3 years ago Annual physical exam   [place] [facility], Netta Neat, DO   3 years ago Acute left-sided low back pain with left-sided sciatica   [facility] Smitty Cords, DO       Future Appointments             In 3 months Althea Charon, Netta Neat, DO [facility] [place]   In 3 months Deirdre Evener, MD [facility]

## 2023-07-09 ENCOUNTER — Ambulatory Visit (INDEPENDENT_AMBULATORY_CARE_PROVIDER_SITE_OTHER): Payer: Medicare HMO | Admitting: Family Medicine

## 2023-07-09 ENCOUNTER — Encounter: Payer: Self-pay | Admitting: Family Medicine

## 2023-07-09 VITALS — BP 140/76 | HR 67 | Ht 72.0 in | Wt 249.0 lb

## 2023-07-09 DIAGNOSIS — J069 Acute upper respiratory infection, unspecified: Secondary | ICD-10-CM | POA: Diagnosis not present

## 2023-07-09 DIAGNOSIS — H109 Unspecified conjunctivitis: Secondary | ICD-10-CM

## 2023-07-09 MED ORDER — POLYMYXIN B-TRIMETHOPRIM 10000-0.1 UNIT/ML-% OP SOLN
1.0000 [drp] | Freq: Four times a day (QID) | OPHTHALMIC | 0 refills | Status: DC
Start: 2023-07-09 — End: 2024-03-04

## 2023-07-09 NOTE — Progress Notes (Addendum)
 Subjective:    Patient ID: Zachary Wall, male    DOB: 1950-10-22, 73 y.o.   MRN: [id number]  Zachary Wall is a 73 y.o. male presenting on 07/09/2023 for Cough   HPI  Discussed the use of AI scribe software for clinical note transcription with the patient, who gave verbal consent to proceed.  History of Present Illness    URI / Conjunctivitis  The patient began experiencing symptoms of a respiratory illness approximately a week ago, characterized by coughing and expectoration of phlegm. Despite these symptoms, the patient did not report any fever or significant malaise that would prompt him to rest. The patient's eyes began watering more severely around the third and fourth day of illness, with redness as well, which prompted concern from a close contact who urged him to seek medical attention.  In the days leading up to the consultation, the patient noticed a decrease in eye watering, suggesting a possible improvement in his condition. He has been managing his symptoms with over-the-counter Mucinex, which he reports has been effective in controlling his congestion. The patient denies any ear involvement or significant nasal discharge.         07/09/2023   10:28 AM 02/18/2023    2:03 PM 10/31/2022    1:17 PM  Depression screen PHQ 2/9  Decreased Interest 0 0 0  Down, Depressed, Hopeless 0 0 0  PHQ - 2 Score 0 0 0  Altered sleeping  0 0  Tired, decreased energy  0 0  Change in appetite  0 0  Feeling bad or failure about yourself   0 0  Trouble concentrating  0 0  Moving slowly or fidgety/restless  0 0  Suicidal thoughts  0 0  PHQ-9 Score  0 0  Difficult doing work/chores  Not difficult at all Not difficult at all       07/09/2023   10:28 AM 02/18/2023    2:03 PM 02/14/2022   10:07 AM 01/09/2021    2:19 PM  GAD 7 : Generalized Anxiety Score  Nervous, Anxious, on Edge 0 0 0 0  Control/stop worrying 0 0 0 0  Worry too much - different things 0 0 0 0  Trouble relaxing 0 0 0 0   Restless 0 0 0 0  Easily annoyed or irritable 0 0 0 0  Afraid - awful might happen 0 0 0 0  Total GAD 7 Score 0 0 0 0  Anxiety Difficulty  Not difficult at all Not difficult at all Not difficult at all    Social History   Tobacco Use   Smoking status: Never   Smokeless tobacco: Never  Substance Use Topics   Alcohol use: No   Drug use: No    Review of Systems Per HPI unless specifically indicated above     Objective:    BP (!) 140/76   Pulse 67   Ht 6' (1.829 m)   Wt 249 lb (112.9 kg)   SpO2 98%   BMI 33.77 kg/m   Wt Readings from Last 3 Encounters:  07/09/23 249 lb (112.9 kg)  02/18/23 239 lb 6.4 oz (108.6 kg)  10/31/22 243 lb (110.2 kg)    Physical Exam Vitals and nursing note reviewed.  Constitutional:      General: He is not in acute distress.    Appearance: He is well-developed. He is not diaphoretic.     Comments: Well-appearing, comfortable, cooperative  HENT:     Head: Normocephalic and  atraumatic.     Right Ear: Tympanic membrane, ear canal and external ear normal. There is no impacted cerumen.     Left Ear: Tympanic membrane, ear canal and external ear normal. There is no impacted cerumen.     Nose: No congestion.  Eyes:     General:        Right eye: No discharge.        Left eye: No discharge.     Extraocular Movements: Extraocular movements intact.     Pupils: Pupils are equal, round, and reactive to light.     Comments: Mild conjunctival injection redness bilateral  Neck:     Thyroid: No thyromegaly.  Cardiovascular:     Rate and Rhythm: Normal rate and regular rhythm.     Pulses: Normal pulses.     Heart sounds: Normal heart sounds. No murmur heard. Pulmonary:     Effort: Pulmonary effort is normal. No respiratory distress.     Breath sounds: Normal breath sounds. No wheezing or rales.  Musculoskeletal:        General: Normal range of motion.     Cervical back: Normal range of motion and neck supple.  Lymphadenopathy:     Cervical: No  cervical adenopathy.  Skin:    General: Skin is warm and dry.     Findings: No erythema or rash.  Neurological:     Mental Status: He is alert and oriented to person, place, and time. Mental status is at baseline.  Psychiatric:        Behavior: Behavior normal.     Comments: Well groomed, good eye contact, normal speech and thoughts     Results for orders placed or performed in visit on 02/11/23  Varicella zoster antibody, IgG   Collection Time: 02/11/23  8:02 AM  Result Value Ref Range   Varicella IgG 1,360.00 index  T4, free   Collection Time: 02/11/23  8:02 AM  Result Value Ref Range   Free T4 1.1 0.8 - 1.8 ng/dL  TSH   Collection Time: 02/11/23  8:02 AM  Result Value Ref Range   TSH 6.91 (H) 0.40 - 4.50 mIU/L  PSA   Collection Time: 02/11/23  8:02 AM  Result Value Ref Range   PSA 2.01 < OR = 4.00 ng/mL  Hemoglobin A1c   Collection Time: 02/11/23  8:02 AM  Result Value Ref Range   Hgb A1c MFr Bld 6.3 (H) <5.7 % of total Hgb   Mean Plasma Glucose 134 mg/dL   eAG (mmol/L) 7.4 mmol/L  Lipid panel   Collection Time: 02/11/23  8:02 AM  Result Value Ref Range   Cholesterol 187 <200 mg/dL   HDL 49 > OR = 40 mg/dL   Triglycerides 868 <849 mg/dL   LDL Cholesterol (Calc) 113 (H) mg/dL (calc)   Total CHOL/HDL Ratio 3.8 <5.0 (calc)   Non-HDL Cholesterol (Calc) 138 (H) <130 mg/dL (calc)  CBC with Differential/Platelet   Collection Time: 02/11/23  8:02 AM  Result Value Ref Range   WBC 5.8 3.8 - 10.8 Thousand/uL   RBC 5.13 4.20 - 5.80 Million/uL   Hemoglobin 16.6 13.2 - 17.1 g/dL   HCT 51.8 61.4 - 49.9 %   MCV 93.8 80.0 - 100.0 fL   MCH 32.4 27.0 - 33.0 pg   MCHC 34.5 32.0 - 36.0 g/dL   RDW 88.0 88.9 - 84.9 %   Platelets 207 140 - 400 Thousand/uL   MPV 10.4 7.5 - 12.5 fL   Neutro Abs  2,784 1,500 - 7,800 cells/uL   Lymphs Abs 2,256 850 - 3,900 cells/uL   Absolute Monocytes 551 200 - 950 cells/uL   Eosinophils Absolute 180 15 - 500 cells/uL   Basophils Absolute 29 0 -  200 cells/uL   Neutrophils Relative % 48 %   Total Lymphocyte 38.9 %   Monocytes Relative 9.5 %   Eosinophils Relative 3.1 %   Basophils Relative 0.5 %  COMPLETE METABOLIC PANEL WITH GFR   Collection Time: 02/11/23  8:02 AM  Result Value Ref Range   Glucose, Bld 109 (H) 65 - 99 mg/dL   BUN 14 7 - 25 mg/dL   Creat 9.09 9.29 - 8.64 mg/dL   eGFR 92 > OR = 60 fO/fpw/8.26f7   BUN/Creatinine Ratio SEE NOTE: 6 - 22 (calc)   Sodium 142 135 - 146 mmol/L   Potassium 4.5 3.5 - 5.3 mmol/L   Chloride 104 98 - 110 mmol/L   CO2 29 20 - 32 mmol/L   Calcium 9.2 8.6 - 10.3 mg/dL   Total Protein 6.6 6.1 - 8.1 g/dL   Albumin 4.1 3.6 - 5.1 g/dL   Globulin 2.5 1.9 - 3.7 g/dL (calc)   AG Ratio 1.6 1.0 - 2.5 (calc)   Total Bilirubin 0.6 0.2 - 1.2 mg/dL   Alkaline phosphatase (APISO) 74 35 - 144 U/L   AST 16 10 - 35 U/L   ALT 21 9 - 46 U/L      Assessment & Plan:   Problem List Items Addressed This Visit   None Visit Diagnoses       Conjunctivitis of both eyes, unspecified conjunctivitis type    -  Primary   Relevant Medications   trimethoprim -polymyxin b  (POLYTRIM ) ophthalmic solution     Viral URI             Upper Respiratory Infection vs Conjunctivitis Symptoms of cough and phlegm production since last week. No fever or significant respiratory distress. Lungs clear on auscultation. Likely viral etiology. -Continue supportive care with Mucinex as needed.  Conjunctivitis Watery eyes with no significant purulent discharge. Likely viral or allergic etiology, but bacterial conjunctivitis cannot be completely ruled out. -Prescribe Polytrim  eye drops, one drop in both eyes four times a day for 7-10 days.         No orders of the defined types were placed in this encounter.   Meds ordered this encounter  Medications   trimethoprim -polymyxin b  (POLYTRIM ) ophthalmic solution    Sig: Place 1 drop into both eyes every 6 (six) hours. For about 7 to 10 days    Dispense:  10 mL    Refill:   0    Follow up plan: Return if symptoms worsen or fail to improve.   Marsa Officer, DO [facility] 07/09/2023, 10:03 AM

## 2023-07-09 NOTE — Patient Instructions (Addendum)
 Thank you for coming to the office today.  Likely conjunctivitis Can be viral or possibly bacterial Try Polytrim  antibiotic eye drops 1 drop each eye, 4 times a day or every 6 hour Use OTC meds as needed No sign of sinus or lung infection today  Please schedule a Follow-up Appointment to: Return if symptoms worsen or fail to improve.  If you have any other questions or concerns, please feel free to call the office or send a message through MyChart. You may also schedule an earlier appointment if necessary.  Additionally, you may be receiving a survey about your experience at our office within a few days to 1 week by e-mail or mail. We value your feedback.  Marsa Officer, DO [facility], [place]

## 2023-08-15 ENCOUNTER — Other Ambulatory Visit: Payer: Self-pay | Admitting: Family Medicine

## 2023-08-15 DIAGNOSIS — I10 Essential (primary) hypertension: Secondary | ICD-10-CM

## 2023-08-15 NOTE — Telephone Encounter (Signed)
Requested Prescriptions  Pending Prescriptions Disp Refills   valsartan (DIOVAN) 160 MG tablet [Pharmacy Med Name: VALSARTAN 160 MG TABLET] 90 tablet 0    Sig: TAKE 1 TABLET BY MOUTH EVERY DAY     Cardiovascular:  Angiotensin Receptor Blockers Failed - 08/15/2023  3:04 PM      Failed - Cr in normal range and within 180 days    Creat  Date Value Ref Range Status  02/11/2023 0.90 0.70 - 1.35 mg/dL Final         Failed - K in normal range and within 180 days    Potassium  Date Value Ref Range Status  02/11/2023 4.5 3.5 - 5.3 mmol/L Final         Failed - Last BP in normal range    BP Readings from Last 1 Encounters:  07/09/23 (!) 140/76         Passed - Patient is not pregnant      Passed - Valid encounter within last 6 months    Recent Outpatient Visits           1 month ago Conjunctivitis of both eyes, unspecified conjunctivitis type   [place] [facility] Smitty Cords, DO   5 months ago Annual physical exam   [place] [facility] Smitty Cords, DO   1 year ago Annual physical exam   [place] [facility] Smitty Cords, DO   2 years ago Annual physical exam   [place] [facility] Smitty Cords, DO   3 years ago Annual physical exam   [place] [facility] Althea Charon, Netta Neat, DO       Future Appointments             In 6 days Althea Charon, Netta Neat, DO  [facility]   In 3 weeks Deirdre Evener, MD [facility]

## 2023-08-21 ENCOUNTER — Ambulatory Visit: Payer: Self-pay | Admitting: Family Medicine

## 2023-08-27 ENCOUNTER — Encounter: Payer: Self-pay | Admitting: Family Medicine

## 2023-08-27 ENCOUNTER — Ambulatory Visit (INDEPENDENT_AMBULATORY_CARE_PROVIDER_SITE_OTHER): Payer: Medicare HMO | Admitting: Family Medicine

## 2023-08-27 ENCOUNTER — Other Ambulatory Visit: Payer: Self-pay | Admitting: Family Medicine

## 2023-08-27 VITALS — BP 130/72 | HR 50 | Ht 72.0 in | Wt 244.0 lb

## 2023-08-27 DIAGNOSIS — E7800 Pure hypercholesterolemia, unspecified: Secondary | ICD-10-CM

## 2023-08-27 DIAGNOSIS — E66811 Obesity, class 1: Secondary | ICD-10-CM

## 2023-08-27 DIAGNOSIS — I10 Essential (primary) hypertension: Secondary | ICD-10-CM | POA: Diagnosis not present

## 2023-08-27 DIAGNOSIS — R7303 Prediabetes: Secondary | ICD-10-CM | POA: Diagnosis not present

## 2023-08-27 DIAGNOSIS — R972 Elevated prostate specific antigen [PSA]: Secondary | ICD-10-CM

## 2023-08-27 DIAGNOSIS — Z0000 Encounter for general adult medical examination without abnormal findings: Secondary | ICD-10-CM

## 2023-08-27 DIAGNOSIS — R7989 Other specified abnormal findings of blood chemistry: Secondary | ICD-10-CM

## 2023-08-27 LAB — POCT GLYCOSYLATED HEMOGLOBIN (HGB A1C): Hemoglobin A1C: 6.3 % — AB (ref 4.0–5.6)

## 2023-08-27 MED ORDER — VALSARTAN 160 MG PO TABS: 160.0000 mg | ORAL_TABLET | Freq: Every day | ORAL | 1 refills | Status: DC

## 2023-08-27 NOTE — Progress Notes (Signed)
 Subjective:    Patient ID: Zachary Wall, male    DOB: December 15, 1950, 73 y.o.   MRN: [id number]  Zachary Wall is a 73 y.o. male presenting on 08/27/2023 for Hypertension and Pre-Diabetes   HPI  Discussed the use of AI scribe software for clinical note transcription with the patient, who gave verbal consent to proceed.  History of Present Illness    Zachary Wall is a 73 year old male with prediabetes and hypertension who presents for a routine follow-up visit.  Pre-Diabetes / OBESITY BMI >33 He has prediabetes with a stable A1c of 6.3% over the past six months. 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  Denies hypoglycemia, polyuria, visual changes, numbness or tingling.   CHRONIC HTN: He has a history of hypertension, with recent blood pressure readings around 130/72 mmHg following medication adjustments over the past six months. Previously, his blood pressure was in the 150s and 160s. His medication dose was increased from 80 mg to 160 mg, contributing to the improvement in blood pressure control. Current Meds - Valsartan 160mg  daily Reports good compliance, took meds today. Tolerating well, w/o complaints. Lifestyle: (see below) Denies CP, dyspnea, HA, edema, dizziness / lightheadedness        07/09/2023   10:28 AM 02/18/2023    2:03 PM 10/31/2022    1:17 PM  Depression screen PHQ 2/9  Decreased Interest 0 0 0  Down, Depressed, Hopeless 0 0 0  PHQ - 2 Score 0 0 0  Altered sleeping  0 0  Tired, decreased energy  0 0  Change in appetite  0 0  Feeling bad or failure about yourself   0 0  Trouble concentrating  0 0  Moving slowly or fidgety/restless  0 0  Suicidal thoughts  0 0  PHQ-9 Score  0 0  Difficult doing work/chores  Not difficult at all Not difficult at all       07/09/2023   10:28 AM 02/18/2023    2:03 PM 02/14/2022   10:07 AM 01/09/2021    2:19 PM  GAD 7 :  Generalized Anxiety Score  Nervous, Anxious, on Edge 0 0 0 0  Control/stop worrying 0 0 0 0  Worry too much - different things 0 0 0 0  Trouble relaxing 0 0 0 0  Restless 0 0 0 0  Easily annoyed or irritable 0 0 0 0  Afraid - awful might happen 0 0 0 0  Total GAD 7 Score 0 0 0 0  Anxiety Difficulty  Not difficult at all Not difficult at all Not difficult at all    Social History   Tobacco Use  . Smoking status: Never  . Smokeless tobacco: Never  Substance Use Topics  . Alcohol use: No  . Drug use: No    Review of Systems Per HPI unless specifically indicated above     Objective:    BP 130/72 (BP Location: Left Arm, Cuff Size: Normal)   Pulse (!) 50   Ht 6' (1.829 m)   Wt 244 lb (110.7 kg)   SpO2 97%   BMI 33.09 kg/m   Wt Readings from Last 3 Encounters:  08/27/23 244 lb (110.7 kg)  07/09/23 249 lb (112.9 kg)  02/18/23 239 lb 6.4 oz (108.6 kg)    Physical Exam Vitals and nursing note reviewed.  Constitutional:      General: He is  not in acute distress.    Appearance: Normal appearance. He is well-developed. He is not diaphoretic.     Comments: Well-appearing, comfortable, cooperative  HENT:     Head: Normocephalic and atraumatic.  Eyes:     General:        Right eye: No discharge.        Left eye: No discharge.     Conjunctiva/sclera: Conjunctivae normal.  Cardiovascular:     Rate and Rhythm: Normal rate.  Pulmonary:     Effort: Pulmonary effort is normal.  Skin:    General: Skin is warm and dry.     Findings: No erythema or rash.  Neurological:     Mental Status: He is alert and oriented to person, place, and time.  Psychiatric:        Mood and Affect: Mood normal.        Behavior: Behavior normal.        Thought Content: Thought content normal.     Comments: Well groomed, good eye contact, normal speech and thoughts     Results for orders placed or performed in visit on 08/27/23  POCT HgB A1C   Collection Time: 08/27/23  9:24 AM  Result Value  Ref Range   Hemoglobin A1C 6.3 (A) 4.0 - 5.6 %   HbA1c POC (<> result, manual entry)     HbA1c, POC (prediabetic range)     HbA1c, POC (controlled diabetic range)        Assessment & Plan:   Problem List Items Addressed This Visit     Essential hypertension   Relevant Medications   valsartan (DIOVAN) 160 MG tablet   Hyperlipidemia   Relevant Medications   valsartan (DIOVAN) 160 MG tablet   Obesity (BMI 30.0-34.9)   Pre-diabetes - Primary   Relevant Orders   POCT HgB A1C (Completed)     Prediabetes Obesity Stable HbA1c at 6.3, unchanged from 6 months prior.  Patient is engaging in regular physical activity with warmer weather Goal is to reduce HbA1c to high 5s or low 6s. -Continue current lifestyle modifications including regular exercise. Not on medication  Hypertension Blood pressure improved with increased medication dose (from 80 to 160). Home readings improved. -Continue current dose of antihypertensive medication - Valsartan 160mg  daily -Check blood pressure regularly at home.  General Health Maintenance -Next appointment scheduled for 6 months from today for routine blood work and follow-up.         Orders Placed This Encounter  Procedures  . POCT HgB A1C    Meds ordered this encounter  Medications  . valsartan (DIOVAN) 160 MG tablet    Sig: Take 1 tablet (160 mg total) by mouth daily.    Dispense:  90 tablet    Refill:  1    Keep on file for future, adding refills only    Follow up plan: Return in about 6 months (around 02/24/2024) for 6 month fasting lab > 1 week later Annual Physical.  Future labs ordered for 02/23/24   Saralyn Pilar, DO [facility] 08/27/2023, 9:31 AM

## 2023-08-27 NOTE — Patient Instructions (Addendum)
 Thank you for coming to the office today.  Recent Labs    02/11/23 0802 08/27/23 0924  HGBA1C 6.3* 6.3*    BP improved on the new dose Valsartan 160mg  daily. New refills added for 1 months.  DUE for FASTING BLOOD WORK (no food or drink after midnight before the lab appointment, only water or coffee without cream/sugar on the morning of)  SCHEDULE "Lab Only" visit in the morning at the clinic for lab draw in 6 MONTHS   - Make sure Lab Only appointment is at about 1 week before your next appointment, so that results will be available  For Lab Results, once available within 2-3 days of blood draw, you can can log in to MyChart online to view your results and a brief explanation. Also, we can discuss results at next follow-up visit.   Please schedule a Follow-up Appointment to: Return in about 6 months (around 02/24/2024) for 6 month fasting lab > 1 week later Annual Physical.  If you have any other questions or concerns, please feel free to call the office or send a message through MyChart. You may also schedule an earlier appointment if necessary.  Additionally, you may be receiving a survey about your experience at our office within a few days to 1 week by e-mail or mail. We value your feedback.  Saralyn Pilar, DO [facility], [place]

## 2023-09-11 ENCOUNTER — Ambulatory Visit: Payer: Medicare HMO | Admitting: Dermatology

## 2023-09-22 ENCOUNTER — Encounter: Payer: Self-pay | Admitting: Dermatology

## 2023-09-22 ENCOUNTER — Ambulatory Visit: Admitting: Dermatology

## 2023-09-22 DIAGNOSIS — B351 Tinea unguium: Secondary | ICD-10-CM | POA: Diagnosis not present

## 2023-09-22 DIAGNOSIS — L814 Other melanin hyperpigmentation: Secondary | ICD-10-CM | POA: Diagnosis not present

## 2023-09-22 DIAGNOSIS — L821 Other seborrheic keratosis: Secondary | ICD-10-CM | POA: Diagnosis not present

## 2023-09-22 DIAGNOSIS — Z1283 Encounter for screening for malignant neoplasm of skin: Secondary | ICD-10-CM | POA: Diagnosis not present

## 2023-09-22 DIAGNOSIS — D229 Melanocytic nevi, unspecified: Secondary | ICD-10-CM

## 2023-09-22 DIAGNOSIS — L578 Other skin changes due to chronic exposure to nonionizing radiation: Secondary | ICD-10-CM

## 2023-09-22 DIAGNOSIS — D1801 Hemangioma of skin and subcutaneous tissue: Secondary | ICD-10-CM

## 2023-09-22 DIAGNOSIS — Z7189 Other specified counseling: Secondary | ICD-10-CM

## 2023-09-22 DIAGNOSIS — Z85828 Personal history of other malignant neoplasm of skin: Secondary | ICD-10-CM | POA: Diagnosis not present

## 2023-09-22 DIAGNOSIS — L570 Actinic keratosis: Secondary | ICD-10-CM | POA: Diagnosis not present

## 2023-09-22 DIAGNOSIS — Z8589 Personal history of malignant neoplasm of other organs and systems: Secondary | ICD-10-CM

## 2023-09-22 DIAGNOSIS — L820 Inflamed seborrheic keratosis: Secondary | ICD-10-CM | POA: Diagnosis not present

## 2023-09-22 DIAGNOSIS — W908XXA Exposure to other nonionizing radiation, initial encounter: Secondary | ICD-10-CM | POA: Diagnosis not present

## 2023-09-22 DIAGNOSIS — Z79899 Other long term (current) drug therapy: Secondary | ICD-10-CM

## 2023-09-22 DIAGNOSIS — B353 Tinea pedis: Secondary | ICD-10-CM | POA: Diagnosis not present

## 2023-09-22 DIAGNOSIS — L918 Other hypertrophic disorders of the skin: Secondary | ICD-10-CM

## 2023-09-22 MED ORDER — CICLOPIROX 8 % EX SOLN: Freq: Every day | CUTANEOUS | 3 refills | Status: AC

## 2023-09-22 MED ORDER — KETOCONAZOLE 2 % EX CREA: TOPICAL_CREAM | CUTANEOUS | 6 refills | Status: AC

## 2023-09-22 NOTE — Progress Notes (Signed)
 Follow-Up Visit   Subjective  Zachary Wall is a 73 y.o. male who presents for the following: Skin Cancer Screening and Full Body Skin Exam, hx of SCC The patient presents for Total-Body Skin Exam (TBSE) for skin cancer screening and mole check. The patient has spots, moles and lesions to be evaluated, some may be new or changing and the patient may have concern these could be cancer.  The following portions of the chart were reviewed this encounter and updated as appropriate: medications, allergies, medical history  Review of Systems:  No other skin or systemic complaints except as noted in HPI or Assessment and Plan.  Objective  Well appearing patient in no apparent distress; mood and affect are within normal limits.  A full examination was performed including scalp, head, eyes, ears, nose, lips, neck, chest, axillae, abdomen, back, buttocks, bilateral upper extremities, bilateral lower extremities, hands, feet, fingers, toes, fingernails, and toenails. All findings within normal limits unless otherwise noted below.   Relevant physical exam findings are noted in the Assessment and Plan.  face,ears, arms x 20 (20) Erythematous thin papules/macules with gritty scale.  arms, hands x 29 (29) Stuck-on, waxy, tan-brown papule or plaque --Discussed benign etiology and prognosis.   Assessment & Plan   SKIN CANCER SCREENING PERFORMED TODAY.  ACTINIC DAMAGE - Chronic condition, secondary to cumulative UV/sun exposure - diffuse scaly erythematous macules with underlying dyspigmentation - Recommend daily broad spectrum sunscreen SPF 30+ to sun-exposed areas, reapply every 2 hours as needed.  - Staying in the shade or wearing long sleeves, sun glasses (UVA+UVB protection) and wide brim hats (4-inch brim around the entire circumference of the hat) are also recommended for sun protection.  - Call for new or changing lesions.  LENTIGINES, SEBORRHEIC KERATOSES, HEMANGIOMAS - Benign normal skin  lesions - Benign-appearing - Call for any changes  MELANOCYTIC NEVI - Tan-brown and/or pink-flesh-colored symmetric macules and papules - Benign appearing on exam today - Observation - Call clinic for new or changing moles - Recommend daily use of broad spectrum spf 30+ sunscreen to sun-exposed areas.   Acrochordons (Skin Tags) - Fleshy, skin-colored pedunculated papules - Benign appearing.  - Observe. - If desired, they can be removed with an in office procedure that is not covered by insurance. - Please call the clinic if you notice any new or changing lesions.   History of Squamous Cell Carcinoma of the Skin - No evidence of recurrence today - No lymphadenopathy - Recommend regular full body skin exams - Recommend daily broad spectrum sunscreen SPF 30+ to sun-exposed areas, reapply every 2 hours as needed.  - Call if any new or changing lesions are noted between office visits - multiple   History of Squamous Cell Carcinoma in Situ of the Skin - No evidence of recurrence today - Recommend regular full body skin exams - Recommend daily broad spectrum sunscreen SPF 30+ to sun-exposed areas, reapply every 2 hours as needed.  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 Condition is symptomatic/ bothersome to patient.  Flared. Start Penlac solution qhs Start Ketoconazole 2% cr qhs to feet  AK (ACTINIC KERATOSIS) (20) face,ears, arms x 20 (20) ACTINIC DAMAGE - chronic, secondary to cumulative UV radiation exposure/sun exposure over time - diffuse scaly erythematous macules with underlying dyspigmentation - Recommend daily  broad spectrum sunscreen SPF 30+ to sun-exposed areas, reapply every 2 hours as needed.  - Recommend staying in the shade or wearing long sleeves, sun glasses (UVA+UVB protection)  and wide brim hats (4-inch brim around the entire circumference of the hat). - Call for new or changing lesions.  Destruction of lesion - face,ears, arms x 20 (20) Complexity: simple   Destruction method: cryotherapy   Informed consent: discussed and consent obtained   Timeout:  patient name, date of birth, surgical site, and procedure verified Lesion destroyed using liquid nitrogen: Yes   Region frozen until ice ball extended beyond lesion: Yes   Outcome: patient tolerated procedure well with no complications   Post-procedure details: wound care instructions given   INFLAMED SEBORRHEIC KERATOSIS (29) arms, hands x 29 (29) Symptomatic, irritating, patient would like treated.  Destruction of lesion - arms, hands x 29 (29) Complexity: simple   Destruction method: cryotherapy   Informed consent: discussed and consent obtained   Timeout:  patient name, date of birth, surgical site, and procedure verified Lesion destroyed using liquid nitrogen: Yes   Region frozen until ice ball extended beyond lesion: Yes   Outcome: patient tolerated procedure well with no complications   Post-procedure details: wound care instructions given     Return in about 1 year (around 09/21/2024) for TBSE, hx of SCC .  IAngelique Holm, CMA, am acting as scribe for Armida Sans, MD .   Documentation: I have reviewed the above documentation for accuracy and completeness, and I agree with the above.  Armida Sans, MD

## 2023-09-22 NOTE — Patient Instructions (Addendum)

## 2023-10-23 ENCOUNTER — Telehealth: Payer: Self-pay | Admitting: Family Medicine

## 2023-10-23 NOTE — Telephone Encounter (Signed)
 LVM 10/23/2023 to r/s AWV due to [facility] not in office on Thursdays khc   Rosalee Collins; Care Guide Ambulatory Clinical Support [facility] Direct Dial: [phone]

## 2024-01-07 ENCOUNTER — Other Ambulatory Visit: Payer: Self-pay | Admitting: Family Medicine

## 2024-01-07 DIAGNOSIS — K219 Gastro-esophageal reflux disease without esophagitis: Secondary | ICD-10-CM

## 2024-01-09 ENCOUNTER — Ambulatory Visit

## 2024-01-09 DIAGNOSIS — Z0000 Encounter for general adult medical examination without abnormal findings: Secondary | ICD-10-CM

## 2024-01-09 NOTE — Telephone Encounter (Signed)
 Requested Prescriptions  Refused Prescriptions Disp Refills   omeprazole  (PRILOSEC) 20 MG capsule [Pharmacy Med Name: OMEPRAZOLE  DR 20 MG CAPSULE] 90 capsule 3    Sig: TAKE 1 CAPSULE BY MOUTH DAILY BEFORE BREAKFAST.     Gastroenterology: Proton Pump Inhibitors Passed - 01/09/2024 12:19 PM      Passed - Valid encounter within last 12 months    Recent Outpatient Visits           4 months ago Pre-diabetes   [place] [facility] [place] Marsa PARAS, DO       Future Appointments             In 8 months Hester Alm BROCKS, MD [facility]

## 2024-01-09 NOTE — Patient Instructions (Addendum)
 Zachary Wall , Thank you for taking time out of your busy schedule to complete your Annual Wellness Visit with me. I enjoyed our conversation and look forward to speaking with you again next year. I, as well as your care team,  appreciate your ongoing commitment to your health goals. Please review the following plan we discussed and let me know if I can assist you in the future.   Follow up Visits: Next Medicare AWV with our clinical staff:   01/14/25 @ 8:50 AM BY PHONE Have you seen your provider in the last 6 months (3 months if uncontrolled diabetes)? Yes   Clinician Recommendations:  Aim for 30 minutes of exercise or brisk walking, 6-8 glasses of water, and 5 servings of fruits and vegetables each day. TAKE CARE!      This is a list of the screening recommended for you and due dates:  Health Maintenance  Topic Date Due   Zoster (Shingles) Vaccine (1 of 2) Never done   COVID-19 Vaccine (4 - 2024-25 season) 03/02/2023   Pneumococcal Vaccine for age over 76 (1 of 1 - PCV) 02/18/2024*   Flu Shot  01/30/2024   DTaP/Tdap/Td vaccine (2 - Td or Tdap) 12/12/2024   Medicare Annual Wellness Visit  01/08/2025   Colon Cancer Screening  05/08/2028   Hepatitis C Screening  Completed   Hepatitis B Vaccine  Aged Out   HPV Vaccine  Aged Out   Meningitis B Vaccine  Aged Out  *Topic was postponed. The date shown is not the original due date.    Advanced directives: (ACP Link)Information on Advanced Care Planning can be found at [place]  Secretary of [facility] Advance Health Care Directives Advance Health Care Directives. [email or web address]  Advance Care Planning is important because it:  [x]  Makes sure you receive the medical care that is consistent with your values, goals, and preferences  [x]  It provides guidance to your family and loved ones and reduces their decisional burden about whether or not they are making the right decisions based on your wishes.  Follow the link provided in your after visit  summary or read over the paperwork we have mailed to you to help you started getting your Advance Directives in place. If you need assistance in completing these, please reach out to us  so that we can help you!

## 2024-01-09 NOTE — Progress Notes (Signed)
 Subjective:   Zachary Wall is a 73 y.o. who presents for a Medicare Wellness preventive visit.  As a reminder, Annual Wellness Visits don't include a physical exam, and some assessments may be limited, especially if this visit is performed virtually. We may recommend an in-person follow-up visit with your provider if needed.  Visit Complete: Virtual I connected with  Zachary Wall on 01/09/24 by a audio enabled telemedicine application and verified that I am speaking with the correct person using two identifiers.  Patient Location: Home  Provider Location: Home Office  I discussed the limitations of evaluation and management by telemedicine. The patient expressed understanding and agreed to proceed.  Vital Signs: Because this visit was a virtual/telehealth visit, some criteria may be missing or patient reported. Any vitals not documented were not able to be obtained and vitals that have been documented are patient reported.  VideoDeclined- This patient declined Librarian, academic. Therefore the visit was completed with audio only.  Persons Participating in Visit: Patient.  AWV Questionnaire: No: Patient Medicare AWV questionnaire was not completed prior to this visit.  Cardiac Risk Factors include: advanced age (>65men, >71 women);dyslipidemia;male gender;hypertension;obesity (BMI >30kg/m2)     Objective:    There were no vitals filed for this visit. There is no height or weight on file to calculate BMI.     01/09/2024    8:55 AM 10/31/2022    1:18 PM 09/21/2021    8:58 AM 05/08/2021    8:43 AM 09/19/2020    8:21 AM 08/18/2018    3:31 PM  Advanced Directives  Does Patient Have a Medical Advance Directive? No No No No Yes No   Type of Agricultural consultant;Living will   Copy of Healthcare Power of Attorney in Chart?     No - copy requested   Would patient like information on creating a medical advance directive? No -  Patient declined No - Patient declined No - Patient declined        Data saved with a previous flowsheet row definition    Current Medications (verified) Outpatient Encounter Medications as of 01/09/2024  Medication Sig   acetaminophen (TYLENOL) 500 MG chewable tablet Chew 500 mg by mouth every 6 (six) hours as needed for pain.   aspirin  EC 81 MG tablet Take 1 tablet (81 mg total) daily by mouth.   ciclopirox  (PENLAC ) 8 % solution Apply topically at bedtime. Apply over nail and surrounding skin. Apply daily over previous coat. After seven (7) days, may remove with alcohol and continue cycle.   ketoconazole  (NIZORAL ) 2 % cream Apply to feet at bedtime   omeprazole  (PRILOSEC) 20 MG capsule Take 1 capsule (20 mg total) by mouth daily before breakfast.   sildenafil  (REVATIO ) 20 MG tablet Take 3-5 pills about 30 min prior to sex   valsartan  (DIOVAN ) 160 MG tablet Take 1 tablet (160 mg total) by mouth daily.   trimethoprim -polymyxin b  (POLYTRIM ) ophthalmic solution Place 1 drop into both eyes every 6 (six) hours. For about 7 to 10 days (Patient not taking: Reported on 01/09/2024)   No facility-administered encounter medications on file as of 01/09/2024.    Allergies (verified) Ace inhibitors and Azithromycin   History: Past Medical History:  Diagnosis Date   Actinic keratosis    Cancer (HCC)    skin   Hypertension    SCC (squamous cell carcinoma) 02/05/2021   R volar forearm, EDC   SCC (  squamous cell carcinoma) 02/27/2022   R hand posterior - ED&C   SCC (squamous cell carcinoma) 02/27/2022   L prox calf sup - ED&C   SCC (squamous cell carcinoma) 02/27/2022   L prox calf inf - ED&C   Squamous cell carcinoma of skin 06/17/2016   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   Squamous cell carcinoma of skin 05/02/2021   R distal calf - ED&C   Past Surgical History:  Procedure Laterality Date   COLONOSCOPY WITH PROPOFOL      COLONOSCOPY WITH PROPOFOL  N/A 05/08/2021   Procedure: COLONOSCOPY WITH  PROPOFOL ;  Surgeon: Therisa Bi, MD;  Location: [facility] ENDOSCOPY;  Service: Gastroenterology;  Laterality: N/A;   SKIN CANCER EXCISION     Family History  Problem Relation Age of Onset   Hematuria Neg Hx    Prostate cancer Neg Hx    Tuberculosis Neg Hx    Kidney cancer Neg Hx    Bladder Cancer Neg Hx    Social History   Socioeconomic History   Marital status: Married    Spouse name: Not on file   Number of children: Not on file   Years of education: Not on file   Highest education level: 12th grade  Occupational History   Not on file  Tobacco Use   Smoking status: Never   Smokeless tobacco: Never  Substance and Sexual Activity   Alcohol use: No   Drug use: No   Sexual activity: Yes  Other Topics Concern   Not on file  Social History Narrative   Not on file   Social Drivers of Health   Financial Resource Strain: Low Risk  (01/09/2024)   Overall Financial Resource Strain (CARDIA)    Difficulty of Paying Living Expenses: Not hard at all  Food Insecurity: No Food Insecurity (01/09/2024)   Hunger Vital Sign    Worried About Running Out of Food in the Last Year: Never true    Ran Out of Food in the Last Year: Never true  Transportation Needs: No Transportation Needs (01/09/2024)   PRAPARE - Administrator, Civil Service (Medical): No    Lack of Transportation (Non-Medical): No  Physical Activity: Sufficiently Active (01/09/2024)   Exercise Vital Sign    Days of Exercise per Week: 4 days    Minutes of Exercise per Session: 40 min  Stress: No Stress Concern Present (01/09/2024)   [employer] of Occupational Health - Occupational Stress Questionnaire    Feeling of Stress: Not at all  Social Connections: Socially Integrated (01/09/2024)   Social Connection and Isolation Panel    Frequency of Communication with Friends and Family: More than three times a week    Frequency of Social Gatherings with Friends and Family: Three times a week    Attends Religious  Services: More than 4 times per year    Active Member of Clubs or Organizations: Yes    Attends Engineer, structural: More than 4 times per year    Marital Status: Married    Tobacco Counseling Counseling given: Not Answered    Clinical Intake:  Pre-visit preparation completed: Yes  Pain : No/denies pain     BMI - recorded: 33.1 Nutritional Status: BMI > 30  Obese Nutritional Risks: None Diabetes: No  Lab Results  Component Value Date   HGBA1C 6.3 (A) 08/27/2023   HGBA1C 6.3 (H) 02/11/2023   HGBA1C 6.1 (H) 02/12/2022     How often do you need to have someone help  you when you read instructions, pamphlets, or other written materials from your doctor or pharmacy?: 1 - Never  Interpreter Needed?: No  Information entered by :: JHONNIE DAS, LPN   Activities of Daily Living    01/09/2024    8:56 AM 01/05/2024    9:22 AM  In your present state of health, do you have any difficulty performing the following activities:  Hearing? 0 0  Vision? 0 0  Difficulty concentrating or making decisions? 0 0  Walking or climbing stairs? 0 0  Dressing or bathing? 0 0  Doing errands, shopping? 0 0  Preparing Food and eating ? N   Using the Toilet? N N  In the past six months, have you accidently leaked urine? N N  Do you have problems with loss of bowel control? N N  Managing your Medications? N N  Managing your Finances? N N  Housekeeping or managing your Housekeeping? N N    Patient Care Team: Edman Marsa PARAS, DO as PCP - General (Family Medicine)  I have updated your Care Teams any recent Medical Services you may have received from other providers in the past year.     Assessment:   This is a routine wellness examination for Demar.  Hearing/Vision screen Hearing Screening - Comments:: NO AIDS Vision Screening - Comments:: READERS-    Goals Addressed             This Visit's Progress    Cut out extra servings         Depression Screen      01/09/2024    8:54 AM 07/09/2023   10:28 AM 02/18/2023    2:03 PM 10/31/2022    1:17 PM 02/14/2022   10:06 AM 09/21/2021    8:57 AM 01/09/2021    2:19 PM  PHQ 2/9 Scores  PHQ - 2 Score 0 0 0 0 0 0 0  PHQ- 9 Score 0  0 0 0  0    Fall Risk     01/09/2024    8:56 AM 01/05/2024    9:22 AM 07/09/2023   10:28 AM 02/18/2023    2:03 PM 10/31/2022    1:20 PM  Fall Risk   Falls in the past year? 0 0 0 0 0  Number falls in past yr: 0 0  0 0  Injury with Fall? 0 0  0 0  Risk for fall due to : No Fall Risks   No Fall Risks No Fall Risks  Follow up Falls evaluation completed   Falls evaluation completed Falls prevention discussed;Falls evaluation completed    MEDICARE RISK AT HOME:  Medicare Risk at Home Any stairs in or around the home?: Yes If so, are there any without handrails?: No Home free of loose throw rugs in walkways, pet beds, electrical cords, etc?: Yes Adequate lighting in your home to reduce risk of falls?: Yes Life alert?: No Use of a cane, walker or w/c?: No Grab bars in the bathroom?: Yes Shower chair or bench in shower?: Yes Elevated toilet seat or a handicapped toilet?: Yes  TIMED UP AND GO:  Was the test performed?  No  Cognitive Function: 6CIT completed    08/18/2018    3:54 PM  MMSE - Mini Mental State Exam  Orientation to time 5  Orientation to Place 5  Registration 3  Attention/ Calculation 5  Recall 3  Language- name 2 objects 2  Language- repeat 1  Language- follow 3 step command 3  Language- read & follow direction 1  Write a sentence 1  Copy design 1  Total score 30        01/09/2024    8:57 AM 10/31/2022    1:25 PM 09/19/2020    8:23 AM  6CIT Screen  What Year? 0 points 0 points 0 points  What month? 0 points 0 points 0 points  What time? 0 points 0 points 0 points  Count back from 20 0 points 0 points 0 points  Months in reverse 0 points 0 points 2 points  Repeat phrase 2 points 0 points 2 points  Total Score 2 points 0 points 4 points     Immunizations Immunization History  Administered Date(s) Administered   PFIZER(Purple Top)SARS-COV-2 Vaccination 08/25/2019, 09/14/2019, 04/17/2020   Tdap 12/13/2014    Screening Tests Health Maintenance  Topic Date Due   Zoster Vaccines- Shingrix (1 of 2) Never done   COVID-19 Vaccine (4 - 2024-25 season) 03/02/2023   Pneumococcal Vaccine: 50+ Years (1 of 1 - PCV) 02/18/2024 (Originally 06/01/2003)   INFLUENZA VACCINE  01/30/2024   DTaP/Tdap/Td (2 - Td or Tdap) 12/12/2024   Medicare Annual Wellness (AWV)  01/08/2025   Colonoscopy  05/08/2028   Hepatitis C Screening  Completed   Hepatitis B Vaccines  Aged Out   HPV VACCINES  Aged Out   Meningococcal B Vaccine  Aged Out    Health Maintenance  Health Maintenance Due  Topic Date Due   Zoster Vaccines- Shingrix (1 of 2) Never done   COVID-19 Vaccine (4 - 2024-25 season) 03/02/2023   Health Maintenance Items Addressed: UP TO DATE ON COLONOSCOPY; UP TO DATE ON TDAP, WANTS NO MORE COVIDS OR PNA SHOTS; NEVER HAD CHICKEN POX   Additional Screening:  Vision Screening: Recommended annual ophthalmology exams for early detection of glaucoma and other disorders of the eye. Would you like a referral to an eye doctor? No    Dental Screening: Recommended annual dental exams for proper oral hygiene  Community Resource Referral / Chronic Care Management: CRR required this visit?  No   CCM required this visit?  No   Plan:    I have personally reviewed and noted the following in the patient's chart:   Medical and social history Use of alcohol, tobacco or illicit drugs  Current medications and supplements including opioid prescriptions. Patient is not currently taking opioid prescriptions. Functional ability and status Nutritional status Physical activity Advanced directives List of other physicians Hospitalizations, surgeries, and ER visits in previous 12 months Vitals Screenings to include cognitive, depression, and  falls Referrals and appointments  In addition, I have reviewed and discussed with patient certain preventive protocols, quality metrics, and best practice recommendations. A written personalized care plan for preventive services as well as general preventive health recommendations were provided to patient.   Jhonnie GORMAN Das, LPN   [id number]/[id number]   After Visit Summary: (MyChart) Due to this being a telephonic visit, the after visit summary with patients personalized plan was offered to patient via MyChart   Notes: Nothing significant to report at this time.

## 2024-02-25 ENCOUNTER — Other Ambulatory Visit: Payer: Medicare HMO

## 2024-02-25 DIAGNOSIS — I10 Essential (primary) hypertension: Secondary | ICD-10-CM | POA: Diagnosis not present

## 2024-02-25 DIAGNOSIS — R7303 Prediabetes: Secondary | ICD-10-CM | POA: Diagnosis not present

## 2024-02-25 DIAGNOSIS — Z0000 Encounter for general adult medical examination without abnormal findings: Secondary | ICD-10-CM

## 2024-02-25 DIAGNOSIS — R7989 Other specified abnormal findings of blood chemistry: Secondary | ICD-10-CM

## 2024-02-25 DIAGNOSIS — E66811 Obesity, class 1: Secondary | ICD-10-CM

## 2024-02-25 DIAGNOSIS — R972 Elevated prostate specific antigen [PSA]: Secondary | ICD-10-CM | POA: Diagnosis not present

## 2024-02-25 DIAGNOSIS — E7800 Pure hypercholesterolemia, unspecified: Secondary | ICD-10-CM

## 2024-02-26 ENCOUNTER — Ambulatory Visit: Payer: Self-pay | Admitting: Family Medicine

## 2024-02-26 LAB — COMPLETE METABOLIC PANEL WITHOUT GFR
AG Ratio: 1.5 (calc) (ref 1.0–2.5)
ALT: 29 U/L (ref 9–46)
AST: 20 U/L (ref 10–35)
Albumin: 4.3 g/dL (ref 3.6–5.1)
Alkaline phosphatase (APISO): 66 U/L (ref 35–144)
BUN: 15 mg/dL (ref 7–25)
CO2: 26 mmol/L (ref 20–32)
Calcium: 9.2 mg/dL (ref 8.6–10.3)
Chloride: 102 mmol/L (ref 98–110)
Creat: 0.98 mg/dL (ref 0.70–1.28)
Globulin: 2.8 g/dL (ref 1.9–3.7)
Glucose, Bld: 113 mg/dL — ABNORMAL HIGH (ref 65–99)
Potassium: 4 mmol/L (ref 3.5–5.3)
Sodium: 138 mmol/L (ref 135–146)
Total Bilirubin: 0.8 mg/dL (ref 0.2–1.2)
Total Protein: 7.1 g/dL (ref 6.1–8.1)

## 2024-02-26 LAB — CBC WITH DIFFERENTIAL/PLATELET
Absolute Lymphocytes: 1964 {cells}/uL (ref 850–3900)
Absolute Monocytes: 495 {cells}/uL (ref 200–950)
Basophils Absolute: 41 {cells}/uL (ref 0–200)
Basophils Relative: 0.8 %
Eosinophils Absolute: 143 {cells}/uL (ref 15–500)
Eosinophils Relative: 2.8 %
HCT: 47.7 % (ref 38.5–50.0)
Hemoglobin: 16.4 g/dL (ref 13.2–17.1)
MCH: 32.3 pg (ref 27.0–33.0)
MCHC: 34.4 g/dL (ref 32.0–36.0)
MCV: 94.1 fL (ref 80.0–100.0)
MPV: 10.2 fL (ref 7.5–12.5)
Monocytes Relative: 9.7 %
Neutro Abs: 2458 {cells}/uL (ref 1500–7800)
Neutrophils Relative %: 48.2 %
Platelets: 207 Thousand/uL (ref 140–400)
RBC: 5.07 Million/uL (ref 4.20–5.80)
RDW: 12 % (ref 11.0–15.0)
Total Lymphocyte: 38.5 %
WBC: 5.1 Thousand/uL (ref 3.8–10.8)

## 2024-02-26 LAB — PSA: PSA: 2.86 ng/mL (ref <=4.00)

## 2024-02-26 LAB — LIPID PANEL
Cholesterol: 176 mg/dL (ref <200)
HDL: 45 mg/dL (ref >=40)
LDL Cholesterol (Calc): 109 mg/dL — ABNORMAL HIGH
Non-HDL Cholesterol (Calc): 131 mg/dL — ABNORMAL HIGH (ref <130)
Total CHOL/HDL Ratio: 3.9 (calc) (ref <5.0)
Triglycerides: 108 mg/dL (ref <150)

## 2024-02-26 LAB — HEMOGLOBIN A1C
Hgb A1c MFr Bld: 6.6 % — ABNORMAL HIGH (ref <5.7)
Mean Plasma Glucose: 143 mg/dL
eAG (mmol/L): 7.9 mmol/L

## 2024-02-26 LAB — T4, FREE: Free T4: 1.4 ng/dL (ref 0.8–1.8)

## 2024-03-04 ENCOUNTER — Ambulatory Visit (INDEPENDENT_AMBULATORY_CARE_PROVIDER_SITE_OTHER): Admitting: Family Medicine

## 2024-03-04 ENCOUNTER — Encounter: Payer: Self-pay | Admitting: Family Medicine

## 2024-03-04 ENCOUNTER — Encounter: Payer: Medicare HMO | Admitting: Family Medicine

## 2024-03-04 VITALS — BP 130/78 | HR 65 | Ht 72.0 in | Wt 244.1 lb

## 2024-03-04 DIAGNOSIS — I10 Essential (primary) hypertension: Secondary | ICD-10-CM | POA: Diagnosis not present

## 2024-03-04 DIAGNOSIS — K219 Gastro-esophageal reflux disease without esophagitis: Secondary | ICD-10-CM

## 2024-03-04 DIAGNOSIS — R7303 Prediabetes: Secondary | ICD-10-CM

## 2024-03-04 DIAGNOSIS — Z0000 Encounter for general adult medical examination without abnormal findings: Secondary | ICD-10-CM

## 2024-03-04 MED ORDER — OMEPRAZOLE 20 MG PO CPDR: 20.0000 mg | DELAYED_RELEASE_CAPSULE | Freq: Every day | ORAL | 3 refills | Status: AC

## 2024-03-04 MED ORDER — VALSARTAN 160 MG PO TABS: 160.0000 mg | ORAL_TABLET | Freq: Every day | ORAL | 3 refills | Status: AC

## 2024-03-04 NOTE — Patient Instructions (Addendum)
 Thank you for coming to the office today.  Recent Labs    08/27/23 0924 02/25/24 0815  HGBA1C 6.3* 6.6*   IBgard OTC Peppermint Oil (Triple Coated Capsule) 180mg  take one 3 times daily to reduce diarrhea  Consider Probiotic options as well for belching  Refilled medications  Future Consideration Coronary Calcium Score Cardiac CT Scan. This is a screening test for patients aged 72-50+ with cardiovascular risk factors or who are healthy but would be interested in Cardiovascular Screening for heart disease. Even if there is a family history of heart disease, this imaging can be useful. Typically it can be done every 5+ years or at a different timeline we agree on  The scan will look at the chest and mainly focus on the heart and identify early signs of calcium build up or blockages within the heart arteries. It is not 100% accurate for identifying blockages or heart disease, but it is useful to help us  predict who may have some early changes or be at risk in the future for a heart attack or cardiovascular problem.  The results are reviewed by a Cardiologist and they will document the results. It should become available on MyChart. Typically the results are divided into percentiles based on other patients of the same demographic and age. So it will compare your risk to others similar to you. If you have a higher score >99 or higher percentile >75%tile, it is recommended to consider Statin cholesterol therapy and or referral to Cardiologist. I will try to help explain your results and if we have questions we can contact the Cardiologist.  You will be contacted for scheduling. Usually it is done at any imaging facility through [facility], [facility] or [facility].  The cost is $99 flat fee total and it does not go through insurance, so no authorization is required.   Please schedule a Follow-up Appointment to: Return for 6 month PreDM A1c.  If you have any  other questions or concerns, please feel free to call the office or send a message through MyChart. You may also schedule an earlier appointment if necessary.  Additionally, you may be receiving a survey about your experience at our office within a few days to 1 week by e-mail or mail. We value your feedback.  Marsa Officer, DO [facility], [place]

## 2024-03-04 NOTE — Progress Notes (Signed)
 Subjective:    Patient ID: Zachary Wall, male    DOB: 04/27/1951, 73 y.o.   MRN: [id number]  Zachary Wall is a 73 y.o. male presenting on 03/04/2024 for Annual Exam   HPI  Discussed the use of AI scribe software for clinical note transcription with the patient, who gave verbal consent to proceed.  History of Present Illness   Zachary Wall is a 73 year old male who presents for an annual physical exam.   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 He admits goal to improve diet and not take medication Lifestyle: - Diet: stable diet, limiting carbs and sweets, drinks mostly water, 1-2 diet drinks daily Denies hypoglycemia, polyuria, visual changes, numbness or tingling.   CHRONIC HTN: Home BP normal readings and some mild elevated. No severe range. He check weekly. Current Meds - Valsartan  160mg  daily Reports good compliance, took meds today. Tolerating well, w/o complaints. Lifestyle: (see below) Denies CP, dyspnea, HA, edema, dizziness / lightheadedness   Erectile Dysfunction Stable chronic problem. He takes Sildenafil  up to 3 pills 20mg  each PRN with good results, request refill today   HYPERLIPIDEMIA - Reports no concerns. Last lipid panel 01/2024 LDL 109 - Currently not on Statin. Not interested. - Taking ASA EC 81mg  daily since last visit, tolerating well - he does admit some mild easy bleeding, but doing  fine - No known CVD or MI in family history   T4 lab normal   Health Maintenance:    Prostate CA Screening: Previously followed by [facility] due to elevated PSA in past. Recent results were range of 1 to 3. Last PSA 2.86 Currently asymptomatic without LUTS. No known family history of prostate CA   UTD colonoscopy - last 2016, per Dr Lamar Holmes - scanned into chart  DUE 5 year, now tubular adenoma, x 1 Repeat Colonoscopy 05/08/21, next due 2029   Decline future Shingrix. Varicella IgG antibody in past was positive but no documented chicken pox.     03/04/2024    1:18 PM 01/09/2024    8:54 AM 07/09/2023   10:28 AM  Depression screen PHQ 2/9  Decreased Interest 0 0 0  Down, Depressed, Hopeless 0 0 0  PHQ - 2 Score 0 0 0  Altered sleeping 0 0   Tired, decreased energy 0 0   Change in appetite 0 0   Feeling bad or failure about yourself  0 0   Trouble concentrating 0 0   Moving slowly or fidgety/restless 0 0   Suicidal thoughts 0 0   PHQ-9 Score 0 0   Difficult doing work/chores  Not difficult at all        03/04/2024    1:18 PM 07/09/2023   10:28 AM 02/18/2023    2:03 PM 02/14/2022   10:07 AM  GAD 7 : Generalized Anxiety Score  Nervous, Anxious, on Edge 0 0 0 0  Control/stop worrying 0 0 0 0  Worry too much - different things 0 0 0 0  Trouble relaxing 0 0 0 0  Restless 0 0 0 0  Easily annoyed or irritable 0 0 0 0  Afraid - awful might happen 0 0 0 0  Total GAD 7 Score 0 0 0 0  Anxiety Difficulty   Not difficult at all Not difficult at all     Past Medical History:  Diagnosis Date   Actinic keratosis    Cancer (HCC)    skin   Hypertension    SCC (squamous cell carcinoma) 02/05/2021   R volar forearm, EDC   SCC (squamous cell carcinoma) 02/27/2022   R hand posterior - ED&C   SCC (squamous cell carcinoma) 02/27/2022   L prox calf sup - ED&C   SCC (squamous cell carcinoma) 02/27/2022   L prox calf inf - ED&C   Squamous cell carcinoma of skin 06/17/2016   L prox  volar forearm SCC in situ   Squamous cell carcinoma of skin 05/02/2021   R distal calf - ED&C   Past Surgical History:  Procedure Laterality Date   COLONOSCOPY WITH PROPOFOL      COLONOSCOPY WITH PROPOFOL  N/A 05/08/2021   Procedure: COLONOSCOPY WITH PROPOFOL ;  Surgeon: Therisa Bi, MD;  Location: [facility] ENDOSCOPY;  Service: Gastroenterology;  Laterality: N/A;   SKIN CANCER EXCISION     Social History   Socioeconomic History   Marital status: Married    Spouse name: Not on file   Number of children: Not on file   Years of education: Not on file   Highest education level: 12th grade  Occupational History   Not on file  Tobacco Use   Smoking status: Never   Smokeless tobacco: Never  Substance and Sexual Activity   Alcohol use: No   Drug use: No   Sexual activity: Yes  Other Topics Concern   Not on file  Social History Narrative   Not on file   Social Drivers of Health   Financial Resource Strain: Low Risk  (01/09/2024)   Overall Financial Resource Strain (CARDIA)    Difficulty of Paying Living Expenses: Not hard at all  Food Insecurity: No Food Insecurity (01/09/2024)   Hunger Vital Sign    Worried About Running Out of Food in the Last Year: Never true    Ran Out of Food in the Last Year: Never true  Transportation Needs: No Transportation Needs (01/09/2024)   PRAPARE - Administrator, Civil Service (Medical): No    Lack of Transportation (Non-Medical): No  Physical Activity: Sufficiently Active (01/09/2024)   Exercise Vital Sign    Days of Exercise per Week: 4 days    Minutes of Exercise per Session: 40 min  Stress: No Stress Concern Present (01/09/2024)   [employer] of Occupational Health - Occupational Stress Questionnaire    Feeling of Stress: Not at all  Social Connections: Socially Integrated (01/09/2024)   Social Connection and Isolation Panel    Frequency of Communication with Friends and Family: More than three times a week    Frequency of Social  Gatherings with Friends and Family: Three times a week    Attends Religious Services: More than 4 times per year    Active Member of Clubs or Organizations: Yes    Attends Banker Meetings: More than 4 times per year    Marital Status: Married  Catering manager Violence: Not At Risk (01/09/2024)   Humiliation,  Afraid, Rape, and Kick questionnaire    Fear of Current or Ex-Partner: No    Emotionally Abused: No    Physically Abused: No    Sexually Abused: No   Family History  Problem Relation Age of Onset   Hematuria Neg Hx    Prostate cancer Neg Hx    Tuberculosis Neg Hx    Kidney cancer Neg Hx    Bladder Cancer Neg Hx    Current Outpatient Medications on File Prior to Visit  Medication Sig   acetaminophen (TYLENOL) 500 MG chewable tablet Chew 500 mg by mouth every 6 (six) hours as needed for pain.   aspirin  EC 81 MG tablet Take 1 tablet (81 mg total) daily by mouth.   ciclopirox  (PENLAC ) 8 % solution Apply topically at bedtime. Apply over nail and surrounding skin. Apply daily over previous coat. After seven (7) days, may remove with alcohol and continue cycle.   ketoconazole  (NIZORAL ) 2 % cream Apply to feet at bedtime   sildenafil  (REVATIO ) 20 MG tablet Take 3-5 pills about 30 min prior to sex   No current facility-administered medications on file prior to visit.    Review of Systems  Constitutional:  Negative for activity change, appetite change, chills, diaphoresis, fatigue and fever.  HENT:  Negative for congestion and hearing loss.   Eyes:  Negative for visual disturbance.  Respiratory:  Negative for cough, chest tightness, shortness of breath and wheezing.   Cardiovascular:  Negative for chest pain, palpitations and leg swelling.  Gastrointestinal:  Negative for abdominal pain, constipation, diarrhea, nausea and vomiting.  Genitourinary:  Negative for dysuria, frequency and hematuria.  Musculoskeletal:  Negative for arthralgias and neck pain.  Skin:  Negative  for rash.  Neurological:  Negative for dizziness, weakness, light-headedness, numbness and headaches.  Hematological:  Negative for adenopathy.  Psychiatric/Behavioral:  Negative for behavioral problems, dysphoric mood and sleep disturbance.    Per HPI unless specifically indicated above     Objective:    BP 130/78 (BP Location: Left Arm, Patient Position: Sitting, Cuff Size: Normal)   Pulse 65   Ht 6' (1.829 m)   Wt 244 lb 2 oz (110.7 kg)   SpO2 97%   BMI 33.11 kg/m   Wt Readings from Last 3 Encounters:  03/04/24 244 lb 2 oz (110.7 kg)  08/27/23 244 lb (110.7 kg)  07/09/23 249 lb (112.9 kg)    Physical Exam Vitals and nursing note reviewed.  Constitutional:      General: He is not in acute distress.    Appearance: He is well-developed. He is not diaphoretic.     Comments: Well-appearing, comfortable, cooperative  HENT:     Head: Normocephalic and atraumatic.  Eyes:     General:        Right eye: No discharge.        Left eye: No discharge.     Conjunctiva/sclera: Conjunctivae normal.     Pupils: Pupils are equal, round, and reactive to light.  Neck:     Thyroid: No thyromegaly.     Vascular: No carotid bruit.  Cardiovascular:     Rate and Rhythm: Normal rate and regular rhythm.     Pulses: Normal pulses.     Heart sounds: Normal heart sounds. No murmur heard. Pulmonary:     Effort: Pulmonary effort is normal. No respiratory distress.     Breath sounds: Normal breath sounds. No wheezing or rales.  Abdominal:     General: Bowel sounds are normal. There  is no distension.     Palpations: Abdomen is soft. There is no mass.     Tenderness: There is no abdominal tenderness.  Musculoskeletal:        General: No tenderness. Normal range of motion.     Cervical back: Normal range of motion and neck supple.     Comments: Upper / Lower Extremities: - Normal muscle tone, strength bilateral upper extremities 5/5, lower extremities 5/5  Lymphadenopathy:     Cervical: No  cervical adenopathy.  Skin:    General: Skin is warm and dry.     Findings: No erythema or rash.  Neurological:     Mental Status: He is alert and oriented to person, place, and time.     Comments: Distal sensation intact to light touch all extremities  Psychiatric:        Mood and Affect: Mood normal.        Behavior: Behavior normal.        Thought Content: Thought content normal.     Comments: Well groomed, good eye contact, normal speech and thoughts     Results for orders placed or performed in visit on 02/25/24  T4, free   Collection Time: 02/25/24  8:15 AM  Result Value Ref Range   Free T4 1.4 0.8 - 1.8 ng/dL  PSA   Collection Time: 02/25/24  8:15 AM  Result Value Ref Range   PSA 2.86 < OR = 4.00 ng/mL  CBC with Differential/Platelet   Collection Time: 02/25/24  8:15 AM  Result Value Ref Range   WBC 5.1 3.8 - 10.8 Thousand/uL   RBC 5.07 4.20 - 5.80 Million/uL   Hemoglobin 16.4 13.2 - 17.1 g/dL   HCT 52.2 61.4 - 49.9 %   MCV 94.1 80.0 - 100.0 fL   MCH 32.3 27.0 - 33.0 pg   MCHC 34.4 32.0 - 36.0 g/dL   RDW 87.9 88.9 - 84.9 %   Platelets 207 140 - 400 Thousand/uL   MPV 10.2 7.5 - 12.5 fL   Neutro Abs 2,458 1,500 - 7,800 cells/uL   Absolute Lymphocytes 1,964 850 - 3,900 cells/uL   Absolute Monocytes 495 200 - 950 cells/uL   Eosinophils Absolute 143 15 - 500 cells/uL   Basophils Absolute 41 0 - 200 cells/uL   Neutrophils Relative % 48.2 %   Total Lymphocyte 38.5 %   Monocytes Relative 9.7 %   Eosinophils Relative 2.8 %   Basophils Relative 0.8 %  COMPLETE METABOLIC PANEL WITH GFR   Collection Time: 02/25/24  8:15 AM  Result Value Ref Range   Glucose, Bld 113 (H) 65 - 99 mg/dL   BUN 15 7 - 25 mg/dL   Creat 9.01 9.29 - 8.71 mg/dL   BUN/Creatinine Ratio SEE NOTE: 6 - 22 (calc)   Sodium 138 135 - 146 mmol/L   Potassium 4.0 3.5 - 5.3 mmol/L   Chloride 102 98 - 110 mmol/L   CO2 26 20 - 32 mmol/L   Calcium 9.2 8.6 - 10.3 mg/dL   Total Protein 7.1 6.1 - 8.1 g/dL    Albumin 4.3 3.6 - 5.1 g/dL   Globulin 2.8 1.9 - 3.7 g/dL (calc)   AG Ratio 1.5 1.0 - 2.5 (calc)   Total Bilirubin 0.8 0.2 - 1.2 mg/dL   Alkaline phosphatase (APISO) 66 35 - 144 U/L   AST 20 10 - 35 U/L   ALT 29 9 - 46 U/L  Hemoglobin A1c   Collection Time: 02/25/24  8:15 AM  Result Value Ref Range   Hgb A1c MFr Bld 6.6 (H) <5.7 %   Mean Plasma Glucose 143 mg/dL   eAG (mmol/L) 7.9 mmol/L  Lipid panel   Collection Time: 02/25/24  8:15 AM  Result Value Ref Range   Cholesterol 176 <200 mg/dL   HDL 45 > OR = 40 mg/dL   Triglycerides 891 <849 mg/dL   LDL Cholesterol (Calc) 109 (H) mg/dL (calc)   Total CHOL/HDL Ratio 3.9 <5.0 (calc)   Non-HDL Cholesterol (Calc) 131 (H) <130 mg/dL (calc)      Assessment & Plan:   Problem List Items Addressed This Visit     Essential hypertension   Relevant Medications   valsartan  (DIOVAN ) 160 MG tablet   Pre-diabetes   Other Visit Diagnoses       Annual physical exam    -  Primary     Gastroesophageal reflux disease without esophagitis       Relevant Medications   omeprazole  (PRILOSEC) 20 MG capsule        Updated Health Maintenance information Reviewed recent lab results with patient Encouraged improvement to lifestyle with diet and exercise Goal of weight loss   Adult Wellness Visit Annual wellness visit completed. Elevated blood glucose identified. Declined flu, pneumonia, and shingles vaccines. Positive varicella antibody test indicates past exposure. Defer shingles vaccine for now  Elevated blood glucose Slightly elevated A1c suggests potential prediabetes now 6.6, 1st reading above 6.5, at risk. Prefers natural management without medication. - Reassess blood glucose levels in six months (March). - Encourage increased physical activity.  Hypertension Blood pressure management with valsartan  160 mg ongoing. - Ensure valsartan  160 mg refills are maintained.  Gastroesophageal reflux disease (GERD) Belching primarily in the  evening when reclining, not associated with typical acid reflux symptoms. Prefers to avoid additional medications. - Continue omeprazole  20 mg daily. - Consider peppermint oil (IB Guard) for gas and bloating.  Heart Health Screening Discussed optional heart scan (CT scan) for cholesterol buildup in arteries. Deferred due to cost, considering for future. - Consider heart scan in the future if desired.         No orders of the defined types were placed in this encounter.   Meds ordered this encounter  Medications   valsartan  (DIOVAN ) 160 MG tablet    Sig: Take 1 tablet (160 mg total) by mouth daily.    Dispense:  90 tablet    Refill:  3    Keep on file for future, adding refills only   omeprazole  (PRILOSEC) 20 MG capsule    Sig: Take 1 capsule (20 mg total) by mouth daily before breakfast.    Dispense:  90 capsule    Refill:  3    Add future refills     Follow up plan: Return for 6 month PreDM A1c.  Marsa Officer, DO [facility] 03/04/2024, 1:27 PM

## 2024-09-03 ENCOUNTER — Ambulatory Visit: Admitting: Family Medicine

## 2024-09-23 ENCOUNTER — Ambulatory Visit: Admitting: Dermatology

## 2025-01-14 ENCOUNTER — Ambulatory Visit

## 2025-01-19 ENCOUNTER — Ambulatory Visit
